# Patient Record
Sex: Female | Born: 1995 | Race: Black or African American | Hispanic: No | Marital: Single | Smoking: Never smoker
Health system: Southern US, Community
[De-identification: ages and names within clinical notes are randomized; demographics above are authoritative.]

## PROBLEM LIST (undated history)

## (undated) DIAGNOSIS — N946 Dysmenorrhea, unspecified: Secondary | ICD-10-CM

## (undated) DIAGNOSIS — L659 Nonscarring hair loss, unspecified: Secondary | ICD-10-CM

## (undated) DIAGNOSIS — F419 Anxiety disorder, unspecified: Secondary | ICD-10-CM

## (undated) DIAGNOSIS — F32A Depression, unspecified: Secondary | ICD-10-CM

## (undated) DIAGNOSIS — D649 Anemia, unspecified: Secondary | ICD-10-CM

## (undated) DIAGNOSIS — A64 Unspecified sexually transmitted disease: Secondary | ICD-10-CM

## (undated) DIAGNOSIS — F329 Major depressive disorder, single episode, unspecified: Secondary | ICD-10-CM

## (undated) HISTORY — DX: Major depressive disorder, single episode, unspecified: F32.9

## (undated) HISTORY — DX: Dysmenorrhea, unspecified: N94.6

## (undated) HISTORY — DX: Unspecified sexually transmitted disease: A64

## (undated) HISTORY — DX: Anemia, unspecified: D64.9

## (undated) HISTORY — DX: Depression, unspecified: F32.A

## (undated) HISTORY — DX: Anxiety disorder, unspecified: F41.9

---
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
## 2014-07-31 ENCOUNTER — Ambulatory Visit (INDEPENDENT_AMBULATORY_CARE_PROVIDER_SITE_OTHER): Payer: BLUE CROSS/BLUE SHIELD | Admitting: Internal Medicine

## 2014-07-31 VITALS — BP 126/60 | HR 98 | Temp 98.0°F | Resp 19 | Ht 59.5 in | Wt 121.0 lb

## 2014-07-31 DIAGNOSIS — S0502XA Injury of conjunctiva and corneal abrasion without foreign body, left eye, initial encounter: Secondary | ICD-10-CM | POA: Diagnosis not present

## 2014-07-31 MED ORDER — CIPROFLOXACIN HCL 0.3 % OP SOLN: 1.0000 [drp] | OPHTHALMIC | Status: DC

## 2014-07-31 NOTE — Patient Instructions (Signed)
Corneal Abrasion The cornea is the clear covering at the front and center of the eye. When looking at the colored portion of the eye (iris), you are looking through the cornea. This very thin tissue is made up of many layers. The surface layer is a single layer of cells (corneal epithelium) and is one of the most sensitive tissues in the body. If a scratch or injury causes the corneal epithelium to come off, it is called a corneal abrasion. If the injury extends to the tissues below the epithelium, the condition is called a corneal ulcer. CAUSES   Scratches.  Trauma.  Foreign body in the eye. Some people have recurrences of abrasions in the area of the original injury even after it has healed (recurrent erosion syndrome). Recurrent erosion syndrome generally improves and goes away with time. SYMPTOMS   Eye pain.  Difficulty or inability to keep the injured eye open.  The eye becomes very sensitive to light.  Recurrent erosions tend to happen suddenly, first thing in the morning, usually after waking up and opening the eye. DIAGNOSIS  Your health care provider can diagnose a corneal abrasion during an eye exam. Dye is usually placed in the eye using a drop or a small paper strip moistened by your tears. When the eye is examined with a special light, the abrasion shows up clearly because of the dye. TREATMENT   Small abrasions may be treated with antibiotic drops or ointment alone.  A pressure patch may be put over the eye. If this is done, follow your doctor's instructions for when to remove the patch. Do not drive or use machines while the eye patch is on. Judging distances is hard to do with a patch on. If the abrasion becomes infected and spreads to the deeper tissues of the cornea, a corneal ulcer can result. This is serious because it can cause corneal scarring. Corneal scars interfere with light passing through the cornea and cause a loss of vision in the involved eye. HOME CARE  INSTRUCTIONS  Use medicine or ointment as directed. Only take over-the-counter or prescription medicines for pain, discomfort, or fever as directed by your health care provider.  Do not drive or operate machinery if your eye is patched. Your ability to judge distances is impaired.  If your health care provider has given you a follow-up appointment, it is very important to keep that appointment. Not keeping the appointment could result in a severe eye infection or permanent loss of vision. If there is any problem keeping the appointment, let your health care provider know. SEEK MEDICAL CARE IF:   You have pain, light sensitivity, and a scratchy feeling in one eye or both eyes.  Any kind of discharge develops from the eye after treatment or if the lids stick together in the morning.  You have the same symptoms in the morning as you did with the original abrasion days, weeks, or months after the abrasion healed. MAKE SURE YOU:   Understand these instructions.  Will watch your condition.  Will get help right away if you are not doing well or get worse. Document Released: 04/03/2000 Document Revised: 04/11/2013 Document Reviewed: 12/12/2012 [facility] Patient Information 2015 [employer], [place]. This information is not intended to replace advice given to you by your health care provider. Make sure you discuss any questions you have with your health care provider.

## 2014-07-31 NOTE — Progress Notes (Addendum)
   Subjective:    Patient ID: Brenda Sanchez, female    DOB: [birth year], 19 y.o.   MRN: [id number]  HPI Mrs. Brenda Sanchez is a 19 year-old female who presents with left eye pain. Onset was earlier today. She describes accidentally poking her eye with the tip of the hard part of a banana. She was not paying attention. She notes eye tearing and pain. Mild photophobia. No fevers or chills. She has tried keeping it closed due to pain. Feeling of foreign body sensation. Usually wears glasses, no contact lenses. Denies any decrease in visual acuity. Has been squinting due to the mild photophobia, which has led to a slight frontal headache.  Sick Contacts: None Recent Travel: None  Dysuria: None Hematuria: None Arthralgias: None  STD Risk Factors: None  No Known Allergies History  Substance Use Topics  . Smoking status: Never Smoker   . Smokeless tobacco: Not on file  . Alcohol Use: No   Active Ambulatory Problems    Diagnosis Date Noted  . No Active Ambulatory Problems   Resolved Ambulatory Problems    Diagnosis Date Noted  . No Resolved Ambulatory Problems   Past Medical History  Diagnosis Date  . Alopecia   . Anemia    Review of Systems 7 point review of systems was performed and was otherwise negative unless noted in the history of present illness.      Objective:   Physical Exam  Eyes:     BP 126/60 mmHg  Pulse 98  Temp(Src) 98 F (36.7 C)  Resp 19  Ht 4' 11.5" (1.511 m)  Wt 121 lb (54.885 kg)  BMI 24.04 kg/m2  SpO2 100% General appearance: alert, cooperative and appears stated age Head: Normocephalic, without obvious abnormality, atraumatic Eyes: Full extraocular movements without pain. No preseptal edema. No globe swelling or obvious sign of disruption. Pupils equal round reactive to light and accommodation. Left eye with bulbar conjunctiva erythematous injection. Tearing without purulent exudates. Funduscopic exam with clear optic disc margins  without papilledema. Grossly normal orbital pressures to palpation. The eye was rinsed with sterile saline after topical anesthetic applied to the left eye. Lids were retracted and no foreign bodies were identified. Staining exam reveals a 1x41mm shallow abdrasion not in the field of vision at the 5:30 position. A second shallow abrasion is seen at the lateral 3:30 position. No dendritic findings. Neck: no adenopathy, no carotid bruit, no JVD, supple, symmetrical, trachea midline and thyroid not enlarged, symmetric, no tenderness/mass/nodules Skin: Skin color, texture, turgor normal. No rashes or lesions Lymph nodes: Cervical, supraclavicular, and axillary nodes normal.  Vision: OU 20/15, OD 20/20, OS 20/15.    Assessment & Plan:  1. Corneal Abrasion, shallow, post-traumatic earlier today.   -Rx ciprofloxacin drops, OS q4h x 2 days, then q6h x 5 days. -Avoid scratching or rubbing the eye -Plan follow-up if noticing worsening visual acuity, purulent exudates, worsening pain, photophobia, headache with nausea or vomiting, fever, or chills. Patient verbalized understanding and agreement.  Dr. Joellyn HaffPick-Jacobs, DO Sports Medicine Fellow  Discussed with Dr. Salem Casterootlittle. I have participated in the care of this patient with the Dr Dewaine OatsPick-Jacob and agree with Diagnosis and Plan as documented. Robert P. Merla Richesoolittle, M.D.

## 2015-01-23 IMAGING — CR DG KNEE COMPLETE 4+V*R*
4 series · 4 of 4 positions shown · non-contrast
Comparison: None.

CLINICAL DATA: Right knee injury and pain. Possible patellar
dislocation.

EXAM:
RIGHT KNEE - COMPLETE 4+ VIEW

[x knee ap right (1 of 4)]
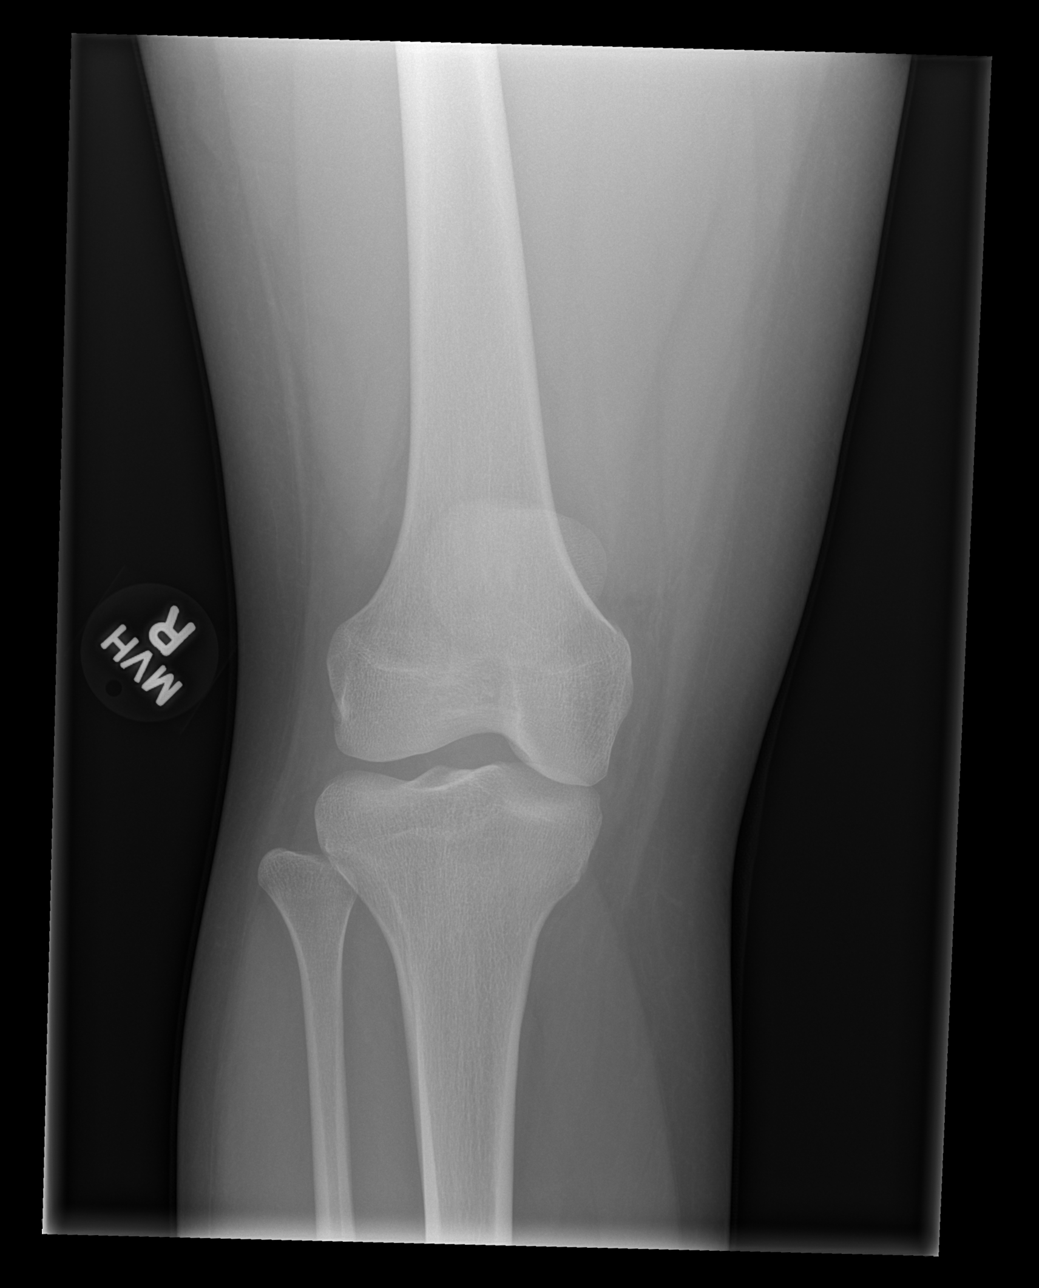

[x knee ap right (2 of 4)]
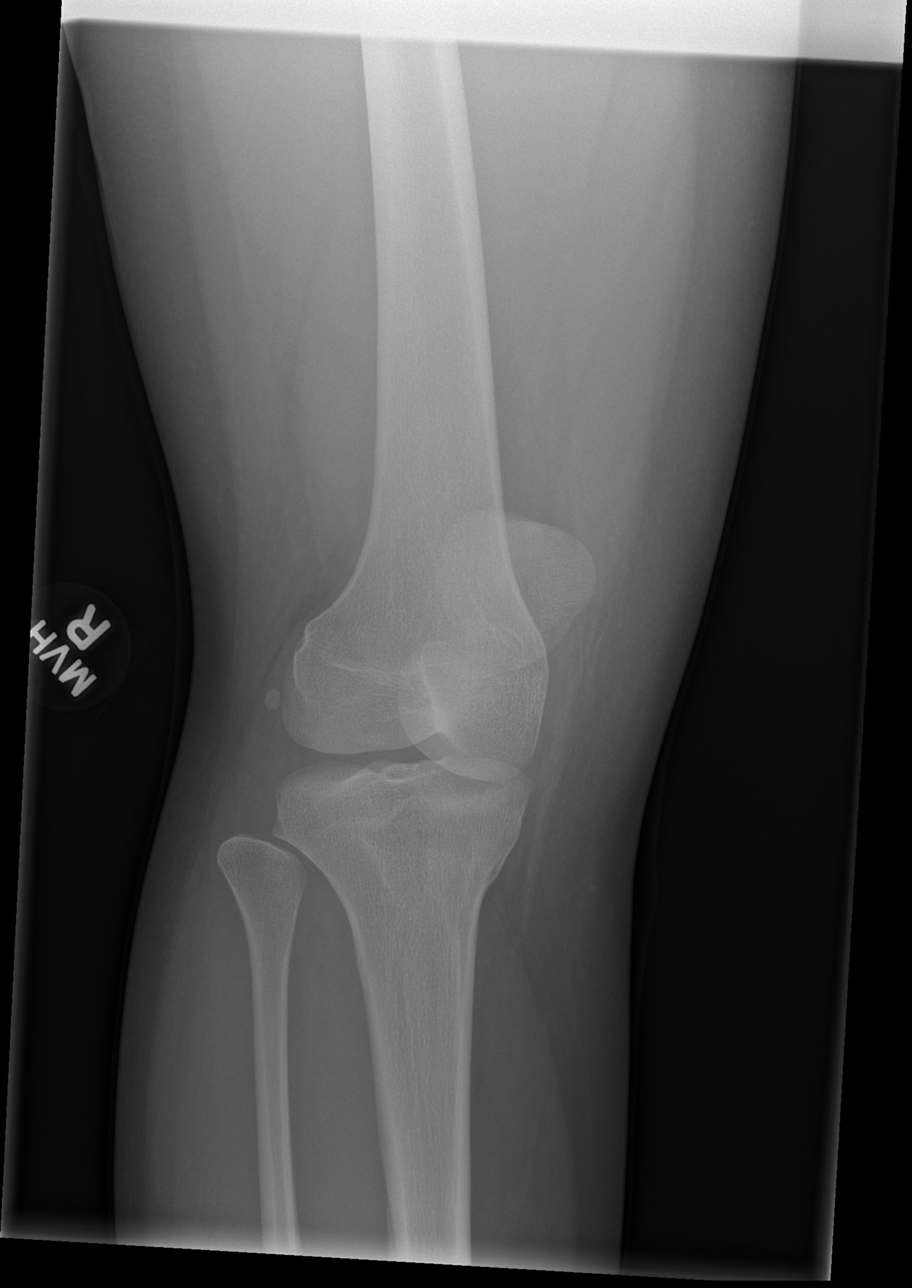

[x knee ap right (3 of 4)]
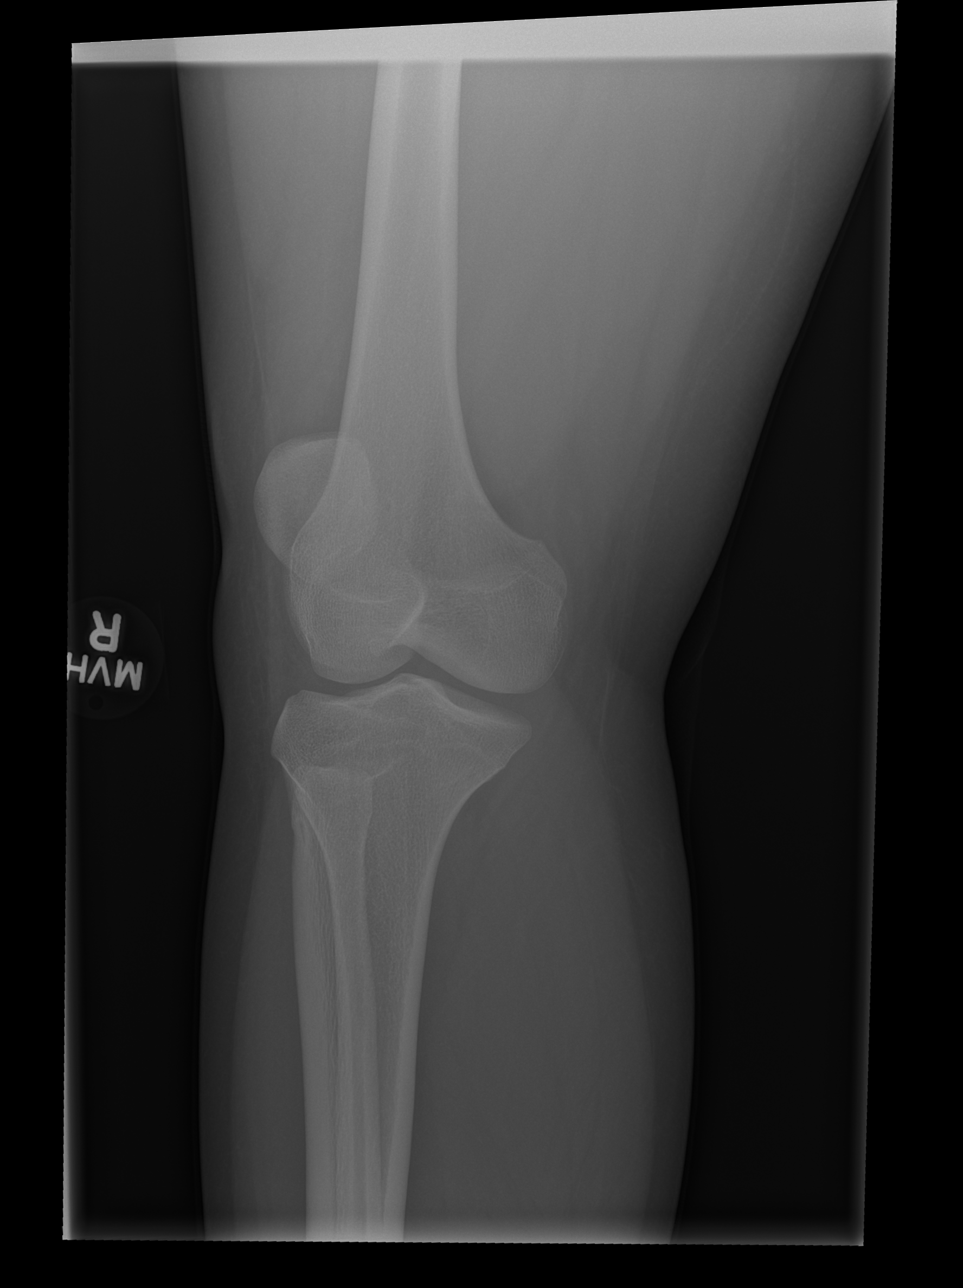

[x knee ap right (4 of 4)]
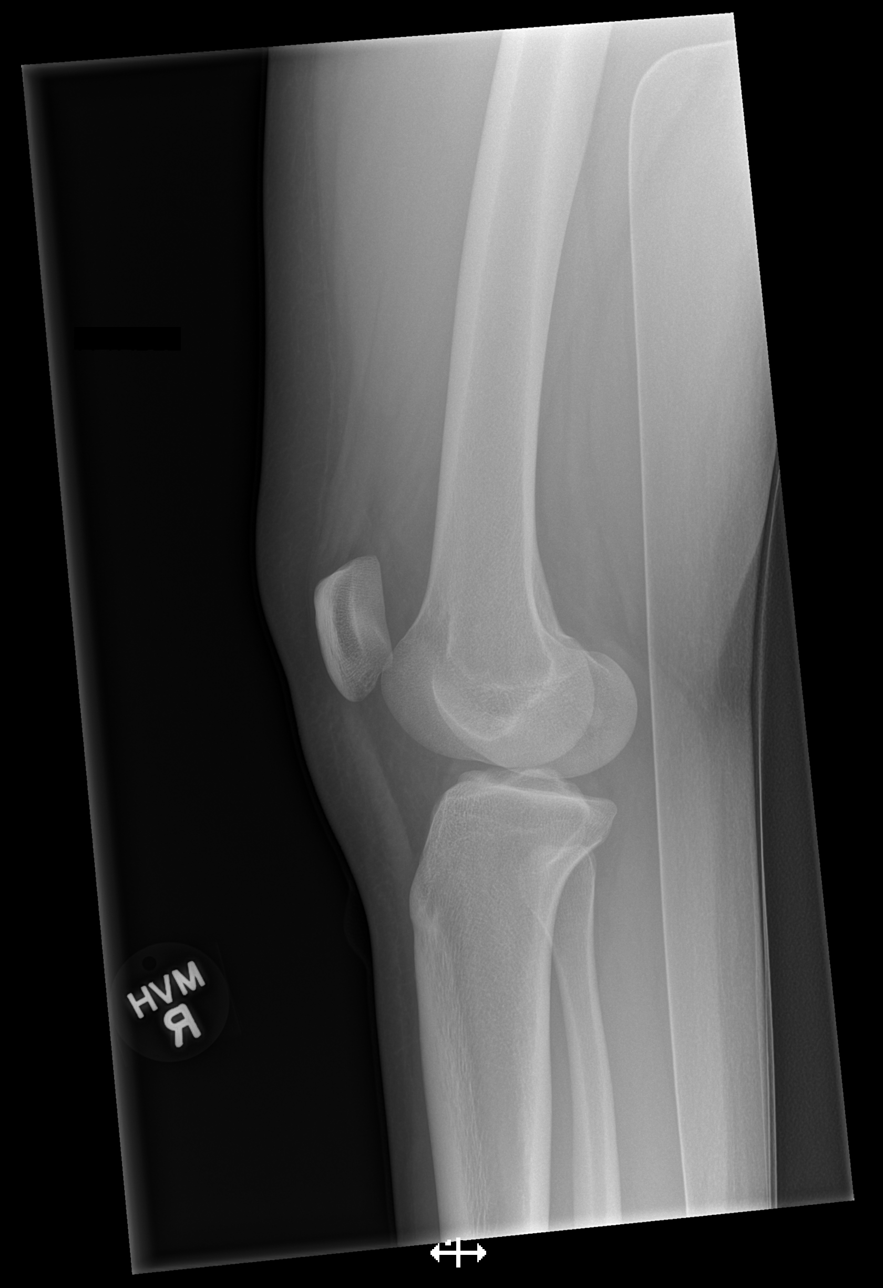

[4 of 4 positions shown; findings below may reference images not displayed]

FINDINGS: Imaged bones, joints and soft tissues appear normal.
IMPRESSION: Negative exam.

## 2017-01-18 DIAGNOSIS — A64 Unspecified sexually transmitted disease: Secondary | ICD-10-CM

## 2017-01-18 HISTORY — DX: Unspecified sexually transmitted disease: A64

## 2017-05-20 ENCOUNTER — Encounter: Payer: Self-pay | Admitting: Obstetrics and Gynecology

## 2017-05-20 ENCOUNTER — Other Ambulatory Visit: Payer: Self-pay

## 2017-05-20 ENCOUNTER — Other Ambulatory Visit (HOSPITAL_COMMUNITY)
Admission: RE | Admit: 2017-05-20 | Discharge: 2017-05-20 | Disposition: A | Discharge status: Home / Self Care | Payer: BLUE CROSS/BLUE SHIELD | Source: Ambulatory Visit | Attending: Obstetrics and Gynecology | Admitting: Obstetrics and Gynecology

## 2017-05-20 ENCOUNTER — Ambulatory Visit: Payer: BLUE CROSS/BLUE SHIELD | Admitting: Obstetrics and Gynecology

## 2017-05-20 ENCOUNTER — Telehealth: Payer: Self-pay | Admitting: Family Medicine

## 2017-05-20 VITALS — BP 100/58 | HR 60 | Resp 12 | Ht 59.0 in | Wt 115.0 lb

## 2017-05-20 DIAGNOSIS — R002 Palpitations: Secondary | ICD-10-CM | POA: Diagnosis not present

## 2017-05-20 DIAGNOSIS — Z124 Encounter for screening for malignant neoplasm of cervix: Secondary | ICD-10-CM

## 2017-05-20 DIAGNOSIS — Z7251 High risk heterosexual behavior: Secondary | ICD-10-CM

## 2017-05-20 DIAGNOSIS — Z113 Encounter for screening for infections with a predominantly sexual mode of transmission: Secondary | ICD-10-CM | POA: Insufficient documentation

## 2017-05-20 DIAGNOSIS — Z0000 Encounter for general adult medical examination without abnormal findings: Secondary | ICD-10-CM | POA: Diagnosis not present

## 2017-05-20 DIAGNOSIS — N898 Other specified noninflammatory disorders of vagina: Secondary | ICD-10-CM

## 2017-05-20 DIAGNOSIS — Z01419 Encounter for gynecological examination (general) (routine) without abnormal findings: Secondary | ICD-10-CM

## 2017-05-20 MED ORDER — NAPROXEN SODIUM 550 MG PO TABS: 550.0000 mg | ORAL_TABLET | Freq: Two times a day (BID) | ORAL | 2 refills | Status: DC

## 2017-05-20 NOTE — Telephone Encounter (Signed)
Copied from CRM [phone]. Topic: Appointment Scheduling - Scheduling Inquiry for Clinic >> May 20, 2017 10:09 AM Stephannie LiSimmons, Obelia Bonello L, NT wrote: Reason for CRM: Irving Burtonmily from Dr Lorin GlassJertsons office called and would like this patient seen by Dr Earlene PlaterWallace  she has heart palpitations , she is scheduled for 07/01/17 at 10:30 ,is it possible for her to get an earlier appointment ? As a new patient

## 2017-05-20 NOTE — Progress Notes (Signed)
22 y.o. G1P0010 SingleAfrican AmericanF here c/o ongoing vaginal discharge with odor.  She has a h/o trich vaginitis and BV in the fall. Her symptoms got better for a few days to a week. She was no longer with a partner. She wasn't sexually active with that partner after that.  The d/c has an odor, copious, watery to thick, yellow to white. Occasionally green, has an odor. Not currently having itching, burning or irritation.  Not current sexually active, sometimes using condoms. She is not interested in hormonal contraception.  Has never had an annual exam.  Period Cycle (Days): 28 Period Duration (Days): 5 days  Period Pattern: Regular Menstrual Flow: Heavy Menstrual Control: Tampon, Maxi pad Menstrual Control Change Freq (Hours): changes pad/ tampon every 2 hours  Dysmenorrhea: (!) Severe Dysmenorrhea Symptoms: Cramping  She saturated a super + tampon in 2 hours. She has missed work because of her cramps.  She has been feeling some heart palpitations, worse at night, skipping a beat, or rapid. Has felt SOB, not typically light head.   Patient's last menstrual period was 05/07/2017.          Sexually active: Yes.    The current method of family planning is none.    Exercising: No.  The patient does not participate in regular exercise at present. Smoker:  no  Health Maintenance: Pap:  Never  History of abnormal Pap:  no TDaP: unsure, thinks UTD Gardasil: unsure, doesn't think so.   reports that  has never smoked. she has never used smokeless tobacco. She reports that she drinks about 1.2 oz of alcohol per week. She reports that she does not use drugs. She is a [employer] senior [employer] studying buisness. Wants to get into interior decorating. 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   Past Medical History:  Diagnosis Date  . Alopecia   . Anemia   . Anxiety   . Depression   . Dysmenorrhea   . STD (sexually transmitted disease) 01/2017   Trichamonus  Doing fine as far as  depression goes. Anxiety comes and goes.   History reviewed. No pertinent surgical history.  Current Outpatient Medications  Medication Sig Dispense Refill  . fluocinolone (SYNALAR) 0.01 % external solution Apply topically 2 (two) times daily.     No current facility-administered medications for this visit.     Family History  Problem Relation Age of Onset  . Multiple sclerosis Mother   . Hypertension Father   . Diabetes Paternal Grandmother   . Hypertension Paternal Grandmother   . Stroke Paternal Grandmother     Review of Systems  Constitutional: Negative.   HENT: Negative.   Eyes: Negative.   Respiratory: Negative.   Cardiovascular: Negative.   Gastrointestinal: Negative.   Endocrine: Negative.   Genitourinary: Positive for vaginal discharge.       Vaginal odor  spotting   Musculoskeletal: Negative.   Skin: Negative.   Allergic/Immunologic: Negative.   Neurological: Negative.   Psychiatric/Behavioral: Negative.   2 days   Exam:   BP (!) 100/58 (BP Location: Right Arm, Patient Position: Sitting, Cuff Size: Normal)   Pulse 60   Resp 12   Ht 4\' 11"  (1.499 m)   Wt 115 lb (52.2 kg)   LMP 05/07/2017   BMI 23.23 kg/m   Weight change: @WEIGHTCHANGE @ Height:   Height: 4\' 11"  (149.9 cm)  Ht Readings from Last 3 Encounters:  05/20/17 4\' 11"  (1.499 m)  07/31/14 4' 11.5" (1.511  m)  05/30/13 4\' 11"  (1.499 m) (2 %, Z= -2.06)*   * Growth percentiles are based on CDC (Girls, 2-20 Years) data.    General appearance: alert, cooperative and appears stated age Head: Normocephalic, without obvious abnormality, atraumatic Neck: no adenopathy, supple, symmetrical, trachea midline and thyroid normal to inspection and palpation Lungs: clear to auscultation bilaterally Cardiovascular: regular rate and rhythm Breasts: normal appearance, no masses or tenderness Abdomen: soft, non-tender; non distended,  no masses,  no organomegaly Extremities: extremities normal, atraumatic, no  cyanosis or edema Skin: Skin color, texture, turgor normal. No rashes or lesions Lymph nodes: Cervical, supraclavicular, and axillary nodes normal. No abnormal inguinal nodes palpated Neurologic: Grossly normal   Pelvic: External genitalia:  no lesions              Urethra:  normal appearing urethra with no masses, tenderness or lesions              Bartholins and Skenes: normal                 Vagina: normal appearing vagina with normal color and discharge, no lesions              Cervix: no lesions               Bimanual Exam:  Uterus:  normal size, contour, position, consistency, mobility, non-tender and anteverted              Adnexa: no mass, fullness, tenderness               Rectovaginal: Confirms               Anus:  normal sphincter tone, no lesions  Chaperone was present for exam.  A:  Well Woman with normal exam  Vaginitis  Dysmenorrhea, severe, misses work  Unprotected intercourse, requests blood pregnancy test  Palpitations  H/O anxiety, has seen a Veterinary surgeon in the past, declines medication.     P:   Screening STD  Pap with GC/CT  Screening labs  Affirm  Information on gardasil, kyleena and mirena IUD, nuvaring  Set her up to see Dr Earlene Plater for heart palpitations, will check TSH  Start Anaprox DS for cramps  Recommended she use condoms every time  Discussed breast self exam  Discussed calcium and vit D intake

## 2017-05-20 NOTE — Patient Instructions (Addendum)
Ethinyl Estradiol; Etonogestrel vaginal ring What is this medicine? ETHINYL ESTRADIOL; ETONOGESTREL (ETH in il es tra DYE ole; et oh noe JES trel) vaginal ring is a flexible, vaginal ring used as a contraceptive (birth control method). This medicine combines two types of female hormones, an estrogen and a progestin. This ring is used to prevent ovulation and pregnancy. Each ring is effective for one month. This medicine may be used for other purposes; ask your health care provider or pharmacist if you have questions. COMMON BRAND NAME(S): NuvaRing What should I tell my health care provider before I take this medicine? They need to know if you have or ever had any of these conditions: -abnormal vaginal bleeding -blood vessel disease or blood clots -breast, cervical, endometrial, ovarian, liver, or uterine cancer -diabetes -gallbladder disease -heart disease or recent heart attack -high blood pressure -high cholesterol -kidney disease -liver disease -migraine headaches -stroke -systemic lupus erythematosus (SLE) -tobacco smoker -an unusual or allergic reaction to estrogens, progestins, other medicines, foods, dyes, or preservatives -pregnant or trying to get pregnant -breast-feeding How should I use this medicine? Insert the ring into your vagina as directed. Follow the directions on the prescription label. The ring will remain place for 3 weeks and is then removed for a 1-week break. A new ring is inserted 1 week after the last ring was removed, on the same day of the week. Check often to make sure the ring is still in place, especially before and after sexual intercourse. If the ring was out of the vagina for an unknown amount of time, you may not be protected from pregnancy. Perform a pregnancy test and call your doctor. Do not use more often than directed. A patient package insert for the product will be given with each prescription and refill. Read this sheet carefully each time. The  sheet may change frequently. Contact your pediatrician regarding the use of this medicine in children. Special care may be needed. This medicine has been used in female children who have started having menstrual periods. Overdosage: If you think you have taken too much of this medicine contact a poison control center or emergency room at once. NOTE: This medicine is only for you. Do not share this medicine with others. What if I miss a dose? You will need to replace your vaginal ring once a month as directed. If the ring should slip out, or if you leave it in longer or shorter than you should, contact your health care professional for advice. What may interact with this medicine? Do not take this medicine with the following medication: -dasabuvir; ombitasvir; paritaprevir; ritonavir -ombitasvir; paritaprevir; ritonavir This medicine may also interact with the following medications: -acetaminophen -antibiotics or medicines for infections, especially rifampin, rifabutin, rifapentine, and griseofulvin, and possibly penicillins or tetracyclines -aprepitant -ascorbic acid (vitamin C) -atorvastatin -barbiturate medicines, such as phenobarbital -bosentan -carbamazepine -caffeine -clofibrate -cyclosporine -dantrolene -doxercalciferol -felbamate -grapefruit juice -hydrocortisone -medicines for anxiety or sleeping problems, such as diazepam or temazepam -medicines for diabetes, including pioglitazone -modafinil -mycophenolate -nefazodone -oxcarbazepine -phenytoin -prednisolone -ritonavir or other medicines for HIV infection or AIDS -rosuvastatin -selegiline -soy isoflavones supplements -St. John's wort -tamoxifen or raloxifene -theophylline -thyroid hormones -topiramate -warfarin This list may not describe all possible interactions. Give your health care provider a list of all the medicines, herbs, non-prescription drugs, or dietary supplements you use. Also tell them if you smoke,  drink alcohol, or use illegal drugs. Some items may interact with your medicine. What should I watch for while using   this medicine? Visit your doctor or health care professional for regular checks on your progress. You will need a regular breast and pelvic exam and Pap smear while on this medicine. Use an additional method of contraception during the first cycle that you use this ring. Do not use a diaphragm or female condom, as the ring can interfere with these birth control methods and their proper placement. If you have any reason to think you are pregnant, stop using this medicine right away and contact your doctor or health care professional. If you are using this medicine for hormone related problems, it may take several cycles of use to see improvement in your condition. Smoking increases the risk of getting a blood clot or having a stroke while you are using hormonal birth control, especially if you are more than 23 years old. You are strongly advised not to smoke. This medicine can make your body retain fluid, making your fingers, hands, or ankles swell. Your blood pressure can go up. Contact your doctor or health care professional if you feel you are retaining fluid. This medicine can make you more sensitive to the sun. Keep out of the sun. If you cannot avoid being in the sun, wear protective clothing and use sunscreen. Do not use sun lamps or tanning beds/booths. If you wear contact lenses and notice visual changes, or if the lenses begin to feel uncomfortable, consult your eye care specialist. In some women, tenderness, swelling, or minor bleeding of the gums may occur. Notify your dentist if this happens. Brushing and flossing your teeth regularly may help limit this. See your dentist regularly and inform your dentist of the medicines you are taking. If you are going to have elective surgery, you may need to stop using this medicine before the surgery. Consult your health care professional  for advice. This medicine does not protect you against HIV infection (AIDS) or any other sexually transmitted diseases. What side effects may I notice from receiving this medicine? Side effects that you should report to your doctor or health care professional as soon as possible: -breast tissue changes or discharge -changes in vaginal bleeding during your period or between your periods -chest pain -coughing up blood -dizziness or fainting spells -headaches or migraines -leg, arm or groin pain -severe or sudden headaches -stomach pain (severe) -sudden shortness of breath -sudden loss of coordination, especially on one side of the body -speech problems -symptoms of vaginal infection like itching, irritation or unusual discharge -tenderness in the upper abdomen -vomiting -weakness or numbness in the arms or legs, especially on one side of the body -yellowing of the eyes or skin Side effects that usually do not require medical attention (report to your doctor or health care professional if they continue or are bothersome): -breakthrough bleeding and spotting that continues beyond the 3 initial cycles of pills -breast tenderness -mood changes, anxiety, depression, frustration, anger, or emotional outbursts -increased sensitivity to sun or ultraviolet light -nausea -skin rash, acne, or brown spots on the skin -weight gain (slight) This list may not describe all possible side effects. Call your doctor for medical advice about side effects. You may report side effects to FDA at 1-800-FDA-1088. Where should I keep my medicine? Keep out of the reach of children. Store at room temperature between 15 and 30 degrees C (59 and 86 degrees F) for up to 4 months. The product will expire after 4 months. Protect from light. Throw away any unused medicine after the expiration date. NOTE: This   sheet is a summary. It may not cover all possible information. If you have questions about this medicine, talk  to your doctor, pharmacist, or health care provider.  2018 Elsevier/Gold Standard (2015-12-13 17:00:31)  Breast Self-Awareness Breast self-awareness means being familiar with how your breasts look and feel. It involves checking your breasts regularly and reporting any changes to your health care provider. Practicing breast self-awareness is important. A change in your breasts can be a sign of a serious medical problem. Being familiar with how your breasts look and feel allows you to find any problems early, when treatment is more likely to be successful. All women should practice breast self-awareness, including women who have had breast implants. How to do a breast self-exam One way to learn what is normal for your breasts and whether your breasts are changing is to do a breast self-exam. To do a breast self-exam: Look for Changes  1. Remove all the clothing above your waist. 2. Stand in front of a mirror in a room with good lighting. 3. Put your hands on your hips. 4. Push your hands firmly downward. 5. Compare your breasts in the mirror. Look for differences between them (asymmetry), such as: ? Differences in shape. ? Differences in size. ? Puckers, dips, and bumps in one breast and not the other. 6. Look at each breast for changes in your skin, such as: ? Redness. ? Scaly areas. 7. Look for changes in your nipples, such as: ? Discharge. ? Bleeding. ? Dimpling. ? Redness. ? A change in position. Feel for Changes  Carefully feel your breasts for lumps and changes. It is best to do this while lying on your back on the floor and again while sitting or standing in the shower or tub with soapy water on your skin. Feel each breast in the following way:  Place the arm on the side of the breast you are examining above your head.  Feel your breast with the other hand.  Start in the nipple area and make  inch (2 cm) overlapping circles to feel your breast. Use the pads of your three  middle fingers to do this. Apply light pressure, then medium pressure, then firm pressure. The light pressure will allow you to feel the tissue closest to the skin. The medium pressure will allow you to feel the tissue that is a little deeper. The firm pressure will allow you to feel the tissue close to the ribs.  Continue the overlapping circles, moving downward over the breast until you feel your ribs below your breast.  Move one finger-width toward the center of the body. Continue to use the  inch (2 cm) overlapping circles to feel your breast as you move slowly up toward your collarbone.  Continue the up and down exam using all three pressures until you reach your armpit.  Write Down What You Find  Write down what is normal for each breast and any changes that you find. Keep a written record with breast changes or normal findings for each breast. By writing this information down, you do not need to depend only on memory for size, tenderness, or location. Write down where you are in your menstrual cycle, if you are still menstruating. If you are having trouble noticing differences in your breasts, do not get discouraged. With time you will become more familiar with the variations in your breasts and more comfortable with the exam. How often should I examine my breasts? Examine your breasts every month. If  you are breastfeeding, the best time to examine your breasts is after a feeding or after using a breast pump. If you menstruate, the best time to examine your breasts is 5-7 days after your period is over. During your period, your breasts are lumpier, and it may be more difficult to notice changes. When should I see my health care provider? See your health care provider if you notice:  A change in shape or size of your breasts or nipples.  A change in the skin of your breast or nipples, such as a reddened or scaly area.  Unusual discharge from your nipples.  A lump or thick area that was  not there before.  Pain in your breasts. Anything that concerns you.  EXERCISE AND DIET:  We recommended that you start or continue a regular exercise program for good health. Regular exercise means any activity that makes your heart beat faster and makes you sweat.  We recommend exercising at least 30 minutes per day at least 3 days a week, preferably 4 or 5.  We also recommend a diet low in fat and sugar.  Inactivity, poor dietary choices and obesity can cause diabetes, heart attack, stroke, and kidney damage, among others.    ALCOHOL AND SMOKING:  Women should limit their alcohol intake to no more than 7 drinks/beers/glasses of wine (combined, not each!) per week. Moderation of alcohol intake to this level decreases your risk of breast cancer and liver damage. And of course, no recreational drugs are part of a healthy lifestyle.  And absolutely no smoking or even second hand smoke. Most people know smoking can cause heart and lung diseases, but did you know it also contributes to weakening of your bones? Aging of your skin?  Yellowing of your teeth and nails?  CALCIUM AND VITAMIN D:  Adequate intake of calcium and Vitamin D are recommended.  The recommendations for exact amounts of these supplements seem to change often, but generally speaking 600 mg of calcium (either carbonate or citrate) and 800 units of Vitamin D per day seems prudent. Certain women may benefit from higher intake of Vitamin D.  If you are among these women, your doctor will have told you during your visit.    PAP SMEARS:  Pap smears, to check for cervical cancer or precancers,  have traditionally been done yearly, although recent scientific advances have shown that most women can have pap smears less often.  However, every woman still should have a physical exam from her gynecologist every year. It will include a breast check, inspection of the vulva and vagina to check for abnormal growths or skin changes, a visual exam of the  cervix, and then an exam to evaluate the size and shape of the uterus and ovaries.  And after 23 years of age, a rectal exam is indicated to check for rectal cancers. We will also provide age appropriate advice regarding health maintenance, like when you should have certain vaccines, screening for sexually transmitted diseases, bone density testing, colonoscopy, mammograms, etc.    This information is not intended to replace advice given to you by your health care provider. Make sure you discuss any questions you have with your health care provider. Document Released: 04/06/2005 Document Revised: 09/12/2015 Document Reviewed: 02/24/2015 Elsevier Interactive Patient Education  [employer]2018 Elsevier Inc.

## 2017-05-20 NOTE — Telephone Encounter (Signed)
See note

## 2017-05-21 ENCOUNTER — Telehealth: Payer: Self-pay | Admitting: *Deleted

## 2017-05-21 LAB — COMPREHENSIVE METABOLIC PANEL
A/G RATIO: 1.6 (ref 1.2–2.2)
ALK PHOS: 49 IU/L (ref 39–117)
ALT: 9 IU/L (ref 0–32)
AST: 19 IU/L (ref 0–40)
Albumin: 4.7 g/dL (ref 3.5–5.5)
BUN/Creatinine Ratio: 20 (ref 9–23)
BUN: 15 mg/dL (ref 6–20)
Bilirubin Total: 0.3 mg/dL (ref 0.0–1.2)
CO2: 21 mmol/L (ref 20–29)
CREATININE: 0.74 mg/dL (ref 0.57–1.00)
Calcium: 9.5 mg/dL (ref 8.7–10.2)
Chloride: 104 mmol/L (ref 96–106)
GFR calc Af Amer: 133 mL/min/{1.73_m2} (ref >59)
GFR calc non Af Amer: 115 mL/min/{1.73_m2} (ref >59)
GLOBULIN, TOTAL: 2.9 g/dL (ref 1.5–4.5)
Glucose: 87 mg/dL (ref 65–99)
POTASSIUM: 4.4 mmol/L (ref 3.5–5.2)
SODIUM: 142 mmol/L (ref 134–144)
Total Protein: 7.6 g/dL (ref 6.0–8.5)

## 2017-05-21 LAB — CBC
HEMATOCRIT: 30.1 % — AB (ref 34.0–46.6)
Hemoglobin: 9.1 g/dL — ABNORMAL LOW (ref 11.1–15.9)
MCH: 25.3 pg — ABNORMAL LOW (ref 26.6–33.0)
MCHC: 30.2 g/dL — ABNORMAL LOW (ref 31.5–35.7)
MCV: 84 fL (ref 79–97)
Platelets: 403 10*3/uL — ABNORMAL HIGH (ref 150–379)
RBC: 3.59 x10E6/uL — ABNORMAL LOW (ref 3.77–5.28)
RDW: 17.4 % — ABNORMAL HIGH (ref 12.3–15.4)
WBC: 3.3 10*3/uL — ABNORMAL LOW (ref 3.4–10.8)

## 2017-05-21 LAB — TSH: TSH: 1.22 u[IU]/mL (ref 0.450–4.500)

## 2017-05-21 LAB — HEP, RPR, HIV PANEL
HIV SCREEN 4TH GENERATION: NONREACTIVE
Hepatitis B Surface Ag: NEGATIVE
RPR Ser Ql: NONREACTIVE

## 2017-05-21 LAB — HEPATITIS C ANTIBODY: HEP C VIRUS AB: 0.7 {s_co_ratio} (ref 0.0–0.9)

## 2017-05-21 LAB — LIPID PANEL
CHOLESTEROL TOTAL: 185 mg/dL (ref 100–199)
Chol/HDL Ratio: 2.8 ratio (ref 0.0–4.4)
HDL: 66 mg/dL (ref >39)
LDL Calculated: 105 mg/dL — ABNORMAL HIGH (ref 0–99)
TRIGLYCERIDES: 71 mg/dL (ref 0–149)
VLDL Cholesterol Cal: 14 mg/dL (ref 5–40)

## 2017-05-21 LAB — VAGINITIS/VAGINOSIS, DNA PROBE
Candida Species: NEGATIVE
Gardnerella vaginalis: POSITIVE — AB
Trichomonas vaginosis: NEGATIVE

## 2017-05-21 LAB — BETA HCG QUANT (REF LAB)

## 2017-05-21 MED ORDER — METRONIDAZOLE 0.75 % VA GEL: 1.0000 | Freq: Two times a day (BID) | VAGINAL | 0 refills | Status: AC

## 2017-05-21 NOTE — Telephone Encounter (Signed)
Scheduled patient for NP appointment 05/31/17 at 11:20.

## 2017-05-21 NOTE — Telephone Encounter (Signed)
-----   Message from Romualdo BolkJill Evelyn Jertson, MD sent at 05/21/2017  9:19 AM EST ----- Please let the patient know that her pregnancy test was negative (that will detect if she got pregnant 7 days prior to the test). Please inform the patient that her vaginitis probe was + for BV and treat with flagyl (either oral or vaginal, her choice), no ETOH while on Flagyl.  Oral: Flagyl 500 mg BID x 7 days, or Vaginal: Metrogel, 1 applicator per vagina q day x 5 days. Some of her blood work is back, not everything, but so far everything is fine.  Pap is pending.

## 2017-05-21 NOTE — Telephone Encounter (Signed)
Spoke with patient and gave results and recommendations. Sent in RX for Metrogel. Patient advised to avoid alcohol while using. Patient voiced understanding -eh

## 2017-05-21 NOTE — Telephone Encounter (Signed)
Okay to work-in sooner if in distress.

## 2017-05-24 ENCOUNTER — Telehealth: Payer: Self-pay

## 2017-05-24 DIAGNOSIS — R7989 Other specified abnormal findings of blood chemistry: Secondary | ICD-10-CM

## 2017-05-24 LAB — CYTOLOGY - PAP
Chlamydia: NEGATIVE
Diagnosis: NEGATIVE
NEISSERIA GONORRHEA: NEGATIVE

## 2017-05-24 NOTE — Telephone Encounter (Signed)
-----   Message from Romualdo BolkJill Evelyn Jertson, MD sent at 05/21/2017  4:04 PM EST ----- Please let the patient know that she is anemic, has a low WBC and elevated Plt's. Please set her up to see hematology for evaluation. I would recommend that she strongly consider the IUD or nuvaring to slow down her bleeding. While waiting to get in to see hematology, please have her start ferrex 150 mg BID.  The rest of her lab work is okay. Pap is still pending.

## 2017-05-24 NOTE — Telephone Encounter (Signed)
Spoke with patient. Results given. Patient verbalizes understanding. Referral placed to Dr.Ennever. Aware she will be contacted to schedule this appointment.  Will close encounter.

## 2017-05-25 ENCOUNTER — Telehealth: Payer: Self-pay | Admitting: Obstetrics and Gynecology

## 2017-05-25 MED ORDER — FLUCONAZOLE 150 MG PO TABS: ORAL_TABLET | ORAL | 0 refills | Status: DC

## 2017-05-25 NOTE — Telephone Encounter (Signed)
Patient has some questions regarding the instructions for her vaginal gel.

## 2017-05-25 NOTE — Telephone Encounter (Signed)
Call to patient. Patient states she was told to only apply metrogel once per night, but that her prescription states to apply twice per day. RN advised per Dr. Salli QuarryJertson's note, patient to only apply once at bedtime. Patient voiced understanding.   Results also reviewed with patient as seen below from Dr. Oscar LaJertson and patient verbalized understanding. Patient states she is having symptoms of yeast off and on and requests prescription for diflucan be sent to pharmacy on file. Diflucan 150mg , #2, 0RF sent to pharmacy on file. Instructions on use reviewed with patient and she verbalized understanding. Aware to return call to office if symptoms do not improve after treatment.   Patient has aex scheduled for 06/02/18.   Routing to provider for final review. Patient agreeable to disposition. Will close encounter.

## 2017-05-25 NOTE — Telephone Encounter (Signed)
-----   Message from Romualdo BolkJill Evelyn Jertson, MD sent at 05/24/2017  5:36 PM EST ----- Please let the patient know that yeast was found on her pap (not her vaginitis panel). If symptomatic treat with diflucan 150 mg x 1, may repeat in 72 hours if still symptomatic. #2, no refills. Negative for GC/CT, 02 recall

## 2017-05-31 ENCOUNTER — Ambulatory Visit (INDEPENDENT_AMBULATORY_CARE_PROVIDER_SITE_OTHER): Payer: BLUE CROSS/BLUE SHIELD | Admitting: Family Medicine

## 2017-05-31 ENCOUNTER — Encounter: Payer: Self-pay | Admitting: Family Medicine

## 2017-05-31 ENCOUNTER — Encounter: Payer: Self-pay | Admitting: Surgical

## 2017-05-31 VITALS — BP 100/60 | HR 76 | Temp 98.4°F | Wt 115.8 lb

## 2017-05-31 DIAGNOSIS — R002 Palpitations: Secondary | ICD-10-CM

## 2017-05-31 DIAGNOSIS — D649 Anemia, unspecified: Secondary | ICD-10-CM | POA: Diagnosis not present

## 2017-05-31 DIAGNOSIS — D500 Iron deficiency anemia secondary to blood loss (chronic): Secondary | ICD-10-CM

## 2017-05-31 LAB — VITAMIN B12: Vitamin B-12: 290 pg/mL (ref 211–911)

## 2017-05-31 NOTE — Progress Notes (Signed)
Estefany A Juluis RainierBradshaw Moody is a 22 y.o. female here for an acute visit, new patient.  History of Present Illness:   Barnie MortJoEllen Thompson, CMA acting as scribe for Dr. Helane RimaErica Torrion Witter.   Palpitations   This is a new problem. The current episode started more than 1 month ago. The problem occurs 2 to 4 times per day. The problem has been rapidly improving. On average, each episode lasts 1 minute. Associated symptoms include coughing, dizziness, an irregular heartbeat, near-syncope and shortness of breath. Pertinent negatives include no chest pain, fever, malaise/fatigue or numbness. She has tried nothing for the symptoms. Her past medical history is significant for anemia and anxiety. There is no history of drug use, heart disease, hyperthyroidism or a valve disorder.   PMHx, SurgHx, SocialHx, Medications, and Allergies were reviewed in the Visit Navigator and updated as appropriate.  Current Medications:   .  fluocinolone (SYNALAR) 0.01 % external solution, Apply topically 2 (two) times daily., Disp: , Rfl:  .  naproxen sodium (ANAPROX DS) 550 MG tablet, Take 1 tablet (550 mg total) by mouth 2 (two) times daily with a meal., Disp: 30 tablet, Rfl: 2   No Known Allergies   Review of Systems:   Pertinent items are noted in the HPI. Otherwise, ROS is negative.  Vitals:   Vitals:   05/31/17 1132  BP: 100/60  Pulse: 76  Temp: 98.4 F (36.9 C)  TempSrc: Oral  SpO2: 98%  Weight: 115 lb 12.8 oz (52.5 kg)     Body mass index is 23.39 kg/m.  Physical Exam:   Physical Exam  Constitutional: She is oriented to person, place, and time. She appears well-developed and well-nourished. No distress.  HENT:  Head: Normocephalic and atraumatic.  Right Ear: External ear normal.  Left Ear: External ear normal.  Nose: Nose normal.  Mouth/Throat: Oropharynx is clear and moist.  Eyes: Conjunctivae and EOM are normal. Pupils are equal, round, and reactive to light.  Neck: Normal range of motion. Neck  supple. No thyromegaly present.  Cardiovascular: Normal rate, regular rhythm, normal heart sounds and intact distal pulses.  Pulmonary/Chest: Effort normal and breath sounds normal.  Abdominal: Soft. Bowel sounds are normal.  Musculoskeletal: Normal range of motion.  Lymphadenopathy:    She has no cervical adenopathy.  Neurological: She is alert and oriented to person, place, and time.  Skin: Skin is warm and dry. Capillary refill takes less than 2 seconds.  Psychiatric: She has a normal mood and affect. Her behavior is normal.  Nursing note and vitals reviewed.   Results for orders placed or performed in visit on 05/31/17  Iron, TIBC and Ferritin Panel  Result Value Ref Range   Iron 33 (L) 40 - 190 mcg/dL   TIBC 130466 (H) 865250 - 784450 mcg/dL (calc)   %SAT 7 (L) 11 - 50 % (calc)   Ferritin 3 (L) 10 - 154 ng/mL  CBC with Differential/Platelet  Result Value Ref Range   WBC 4.8 3.8 - 10.8 Thousand/uL   RBC 3.73 (L) 3.80 - 5.10 Million/uL   Hemoglobin 9.3 (L) 11.7 - 15.5 g/dL   HCT 69.630.2 (L) 29.535.0 - 28.445.0 %   MCV 81.0 80.0 - 100.0 fL   MCH 24.9 (L) 27.0 - 33.0 pg   MCHC 30.8 (L) 32.0 - 36.0 g/dL   RDW 13.217.0 (H) 44.011.0 - 10.215.0 %   Platelets 253 140 - 400 Thousand/uL   MPV 11.0 7.5 - 12.5 fL   Neutro Abs 2,774 1,500 -  7,800 cells/uL   Lymphs Abs 1,474 850 - 3,900 cells/uL   WBC mixed population 442 200 - 950 cells/uL   Eosinophils Absolute 82 15 - 500 cells/uL   Basophils Absolute 29 0 - 200 cells/uL   Neutrophils Relative % 57.8 %   Total Lymphocyte 30.7 %   Monocytes Relative 9.2 %   Eosinophils Relative 1.7 %   Basophils Relative 0.6 %  B12  Result Value Ref Range   Vitamin B-12 290 211 - 911 pg/mL   Assessment and Plan:   1. Iron deficiency anemia due to chronic blood loss Anemia that is consistent with iron deficiency.  I will inform the patient.  Think that she will be better off getting an iron infusion.  She is not reliable when it comes to taking iron supplements per her own  report.  - Iron, TIBC and Ferritin Panel - CBC with Differential/Platelet - B12  2. Palpitations EKG is normal today.  Palpitations are possibly related to anemia.  However, she describes feeling in a kick and then her heart races.  We will work on correcting the anemia.  I did discuss sending to cardiology for possible placement of an event monitor.  - EKG 12-Lead   . Reviewed expectations re: course of current medical issues. . Discussed self-management of symptoms. . Outlined signs and symptoms indicating need for more acute intervention. . Patient verbalized understanding and all questions were answered. Marland Kitchen Health Maintenance issues including appropriate healthy diet, exercise, and smoking avoidance were discussed with patient. . See orders for this visit as documented in the electronic medical record. . Patient received an After Visit Summary.  CMA served as Neurosurgeon during this visit. History, Physical, and Plan performed by medical provider. The above documentation has been reviewed and is accurate and complete. Helane Rima, D.O.   Helane Rima, DO [place] [place] [facility] 06/01/2017

## 2017-05-31 NOTE — Progress Notes (Signed)
   Brenda Sanchez is a 22 y.o. [facility]   Patient Care Team: Helane RimaWallace, Yaileen Hofferber, DO as PCP - General (Family Medicine)   History of Present Illness:   HPI: See Assessment and Plan section for Problem Based Charting of issues discussed today.  Health Maintenance Due  Topic Date Due  . TETANUS/TDAP  06/07/2013  . INFLUENZA VACCINE  11/18/2016   No flowsheet data found. PMHx, SurgHx, SocialHx, Medications, and Allergies were reviewed in the Visit Navigator and updated as appropriate.   Past Medical History:  Diagnosis Date  . Alopecia   . Anemia   . Anxiety   . Depression   . Dysmenorrhea   . STD (sexually transmitted disease) 01/2017   Trichamonus   No past surgical history on file.  Family History  Problem Relation Age of Onset  . Multiple sclerosis Mother   . Hypertension Father   . Diabetes Paternal Grandmother   . Hypertension Paternal Grandmother   . Stroke Paternal Grandmother    Social History   Tobacco Use  . Smoking status: Never Smoker  . Smokeless tobacco: Never Used  Substance Use Topics  . Alcohol use: Yes    Alcohol/week: 1.2 oz    Types: 2 Standard drinks or equivalent per week  . Drug use: No    Current Medications and Allergies:   Current Outpatient Medications:  .  fluocinolone (SYNALAR) 0.01 % external solution, Apply topically 2 (two) times daily., Disp: , Rfl:  .  naproxen sodium (ANAPROX DS) 550 MG tablet, Take 1 tablet (550 mg total) by mouth 2 (two) times daily with a meal., Disp: 30 tablet, Rfl: 2  No Known Allergies   Review of Systems:   Pertinent items are noted in the HPI. Otherwise, ROS is negative.  Vitals:   Vitals:   05/31/17 1132  BP: 100/60  Pulse: 76  Temp: 98.4 F (36.9 C)  TempSrc: Oral  SpO2: 98%  Weight: 115 lb 12.8 oz (52.5 kg)     Body mass index is 23.39 kg/m.  Physical Exam:   Physical Exam  Constitutional: She is oriented to person, place, and time. She appears  well-developed and well-nourished. No distress.  HENT:  Head: Normocephalic and atraumatic.  Right Ear: External ear normal.  Left Ear: External ear normal.  Nose: Nose normal.  Mouth/Throat: Oropharynx is clear and moist.  Eyes: Conjunctivae and EOM are normal. Pupils are equal, round, and reactive to light.  Neck: Normal range of motion. Neck supple. No thyromegaly present.  Cardiovascular: Normal rate, regular rhythm, normal heart sounds and intact distal pulses.  Pulmonary/Chest: Effort normal and breath sounds normal.  Abdominal: Soft. Bowel sounds are normal.  Musculoskeletal: Normal range of motion.  Lymphadenopathy:    She has no cervical adenopathy.  Neurological: She is alert and oriented to person, place, and time.  Skin: Skin is warm and dry. Capillary refill takes less than 2 seconds.  Psychiatric: She has a normal mood and affect. Her behavior is normal.  Nursing note and vitals reviewed.   Assessment and Plan:

## 2017-06-01 DIAGNOSIS — D500 Iron deficiency anemia secondary to blood loss (chronic): Secondary | ICD-10-CM | POA: Insufficient documentation

## 2017-06-01 DIAGNOSIS — R002 Palpitations: Secondary | ICD-10-CM | POA: Insufficient documentation

## 2017-06-01 LAB — CBC WITH DIFFERENTIAL/PLATELET
Basophils Absolute: 29 cells/uL (ref 0–200)
Basophils Relative: 0.6 %
Eosinophils Absolute: 82 cells/uL (ref 15–500)
Eosinophils Relative: 1.7 %
HCT: 30.2 % — ABNORMAL LOW (ref 35.0–45.0)
Hemoglobin: 9.3 g/dL — ABNORMAL LOW (ref 11.7–15.5)
Lymphs Abs: 1474 cells/uL (ref 850–3900)
MCH: 24.9 pg — ABNORMAL LOW (ref 27.0–33.0)
MCHC: 30.8 g/dL — ABNORMAL LOW (ref 32.0–36.0)
MCV: 81 fL (ref 80.0–100.0)
MPV: 11 fL (ref 7.5–12.5)
Monocytes Relative: 9.2 %
Neutro Abs: 2774 cells/uL (ref 1500–7800)
Neutrophils Relative %: 57.8 %
Platelets: 253 10*3/uL (ref 140–400)
RBC: 3.73 10*6/uL — ABNORMAL LOW (ref 3.80–5.10)
RDW: 17 % — ABNORMAL HIGH (ref 11.0–15.0)
Total Lymphocyte: 30.7 %
WBC mixed population: 442 cells/uL (ref 200–950)
WBC: 4.8 10*3/uL (ref 3.8–10.8)

## 2017-06-01 LAB — IRON,TIBC AND FERRITIN PANEL
%SAT: 7 % (calc) — ABNORMAL LOW (ref 11–50)
Ferritin: 3 ng/mL — ABNORMAL LOW (ref 10–154)
Iron: 33 ug/dL — ABNORMAL LOW (ref 40–190)
TIBC: 466 mcg/dL (calc) — ABNORMAL HIGH (ref 250–450)

## 2017-06-02 MED ORDER — FERUMOXYTOL INJECTION 510 MG/17 ML: 510.0000 mg | Freq: Once | INTRAVENOUS | 0 refills | Status: DC

## 2017-06-02 NOTE — Addendum Note (Signed)
Addended by: Helane RimaWALLACE, Andersen Mckiver R on: 06/02/2017 02:19 PM   Modules accepted: Orders

## 2017-06-17 ENCOUNTER — Ambulatory Visit: Payer: Self-pay | Admitting: Family

## 2017-06-17 ENCOUNTER — Other Ambulatory Visit: Payer: Self-pay

## 2017-07-01 ENCOUNTER — Ambulatory Visit: Payer: Self-pay | Admitting: Family Medicine

## 2017-11-15 ENCOUNTER — Telehealth: Payer: Self-pay | Admitting: Obstetrics and Gynecology

## 2017-11-15 NOTE — Telephone Encounter (Signed)
Spoke with patient. Reports green vaginal d/c since June and vaginal odor since early July. LMP 11/01/17. Dysuria on 7/28. Spotting light pink for 2-3 days. SA, recent new partner. No contraceptive.   Denies fever/chills, N/V, lower back pain, urinary frequency or urgency.   Requesting OV with any covering provider. OV scheduled for 7/30 at 2pm with Leota Sauerseborah Leonard, CNM. Patient verbalizes understanding.  Routing to covering provider for final review. Patient is agreeable to disposition. Will close encounter.  Cc: Leota Sauerseborah Leonard, CNM

## 2017-11-15 NOTE — Telephone Encounter (Signed)
Patient is having vaginal odor and would like an appointment. Patient is aware that Dr.Jertson is out of the office this week. Patient is okay to see another provider this week.

## 2017-11-16 ENCOUNTER — Telehealth: Payer: Self-pay | Admitting: Certified Nurse Midwife

## 2017-11-16 ENCOUNTER — Ambulatory Visit: Payer: BLUE CROSS/BLUE SHIELD | Admitting: Certified Nurse Midwife

## 2017-11-16 NOTE — Telephone Encounter (Signed)
Patient called to state she is having car trouble and cannot make appointment today at 2pm. Patient requested to reschedule appointment for Friday. Appointment scheduled for Friday 11/19/17 at 12:45 with Leota Sauerseborah Leonard.

## 2017-11-16 NOTE — Telephone Encounter (Signed)
Message noted, patient coming for vaginal discharge. Okay for appointment on Fri

## 2017-11-16 NOTE — Progress Notes (Deleted)
22 y.o. Single {Race/ethnicity:17218} female G1P0010 here with complaint of vaginal symptoms of itching, burning, and increase discharge. Describes discharge as ***. Onset of symptoms *** days ago. Denies new personal products or vaginal dryness. *** STD concerns. Urinary symptoms *** . Contraception is  ROS  O:Healthy female WDWN Affect: normal, orientation x 3  Exam: Abdomen: Lymph node: no enlargement or tenderness Pelvic exam: External genital: normal female BUS: negative Vagina: *** discharge noted. Ph:   ,Wet prep taken, Affirm taken Cervix: normal, non tender, no CMT Uterus: normal, non tender Adnexa:normal, non tender, no masses or fullness noted   Wet Prep results:   A:Normal pelvic exam   P:Discussed findings of *** and etiology. Discussed Aveeno or baking soda sitz bath for comfort. Avoid moist clothes or pads for extended period of time. If working out in gym clothes or swim suits for long periods of time change underwear or bottoms of swimsuit if possible. Coconut Oil use for skin protection prior to activity can be used to external skin for protection or dryness. Rx: ***  Rv prn

## 2017-11-19 ENCOUNTER — Encounter: Payer: Self-pay | Admitting: Certified Nurse Midwife

## 2017-11-19 ENCOUNTER — Ambulatory Visit (INDEPENDENT_AMBULATORY_CARE_PROVIDER_SITE_OTHER): Payer: 59 | Admitting: Certified Nurse Midwife

## 2017-11-19 ENCOUNTER — Other Ambulatory Visit: Payer: Self-pay

## 2017-11-19 VITALS — BP 98/60 | HR 64 | Resp 16 | Ht 59.0 in | Wt 117.0 lb

## 2017-11-19 DIAGNOSIS — Z113 Encounter for screening for infections with a predominantly sexual mode of transmission: Secondary | ICD-10-CM

## 2017-11-19 DIAGNOSIS — N898 Other specified noninflammatory disorders of vagina: Secondary | ICD-10-CM | POA: Diagnosis not present

## 2017-11-19 NOTE — Patient Instructions (Signed)
Sexually Transmitted Disease  A sexually transmitted disease (STD) is a disease or infection that may be passed (transmitted) from person to person, usually during sexual activity. This may happen by way of saliva, semen, blood, vaginal mucus, or urine. Common STDs include:   Gonorrhea.   Chlamydia.   Syphilis.   HIV and AIDS.   Genital herpes.   Hepatitis B and C.   Trichomonas.   Human papillomavirus (HPV).   Pubic lice.   Scabies.   Mites.   Bacterial vaginosis.    What are the causes?  An STD may be caused by bacteria, a virus, or parasites. STDs are often transmitted during sexual activity if one person is infected. However, they may also be transmitted through nonsexual means. STDs may be transmitted after:   Sexual intercourse with an infected person.   Sharing sex toys with an infected person.   Sharing needles with an infected person or using unclean piercing or tattoo needles.   Having intimate contact with the genitals, mouth, or rectal areas of an infected person.   Exposure to infected fluids during birth.    What are the signs or symptoms?  Different STDs have different symptoms. Some people may not have any symptoms. If symptoms are present, they may include:   Painful or bloody urination.   Pain in the pelvis, abdomen, vagina, anus, throat, or eyes.   A skin rash, itching, or irritation.   Growths, ulcerations, blisters, or sores in the genital and anal areas.   Abnormal vaginal discharge with or without bad odor.   Penile discharge in men.   Fever.   Pain or bleeding during sexual intercourse.   Swollen glands in the groin area.   Yellow skin and eyes (jaundice). This is seen with hepatitis.   Swollen testicles.   Infertility.   Sores and blisters in the mouth.    How is this diagnosed?  To make a diagnosis, your health care provider may:   Take a medical history.   Perform a physical exam.   Take a sample of any discharge to examine.   Swab the throat, cervix,  opening to the penis, rectum, or vagina for testing.   Test a sample of your first morning urine.   Perform blood tests.   Perform a Pap test, if this applies.   Perform a colposcopy.   Perform a laparoscopy.    How is this treated?  Treatment depends on the STD. Some STDs may be treated but not cured.   Chlamydia, gonorrhea, trichomonas, and syphilis can be cured with antibiotic medicine.   Genital herpes, hepatitis, and HIV can be treated, but not cured, with prescribed medicines. The medicines lessen symptoms.   Genital warts from HPV can be treated with medicine or by freezing, burning (electrocautery), or surgery. Warts may come back.   HPV cannot be cured with medicine or surgery. However, abnormal areas may be removed from the cervix, vagina, or vulva.   If your diagnosis is confirmed, your recent sexual partners need treatment. This is true even if they are symptom-free or have a negative culture or evaluation. They should not have sex until their health care providers say it is okay.   Your health care provider may test you for infection again 3 months after treatment.    How is this prevented?  Take these steps to reduce your risk of getting an STD:   Use latex condoms, dental dams, and water-soluble lubricants during sexual activity. Do not use   petroleum jelly or oils.   Avoid having multiple sex partners.   Do not have sex with someone who has other sex partners.   Do not have sex with anyone you do not know or who is at high risk for an STD.   Avoid risky sex practices that can break your skin.   Do not have sex if you have open sores on your mouth or skin.   Avoid drinking too much alcohol or taking illegal drugs. Alcohol and drugs can affect your judgment and put you in a vulnerable position.   Avoid engaging in oral and anal sex acts.   Get vaccinated for HPV and hepatitis. If you have not received these vaccines in the past, talk to your health care provider about whether one or  both might be right for you.   If you are at risk of being infected with HIV, it is recommended that you take a prescription medicine daily to prevent HIV infection. This is called pre-exposure prophylaxis (PrEP). You are considered at risk if:  ? You are a man who has sex with other men (MSM).  ? You are a heterosexual man or woman and are sexually active with more than one partner.  ? You take drugs by injection.  ? You are sexually active with a partner who has HIV.   Talk with your health care provider about whether you are at high risk of being infected with HIV. If you choose to begin PrEP, you should first be tested for HIV. You should then be tested every 3 months for as long as you are taking PrEP.    Contact a health care provider if:   See your health care provider.   Tell your sexual partner(s). They should be tested and treated for any STDs.   Do not have sex until your health care provider says it is okay.  Get help right away if:  Contact your health care provider right away if:   You have severe abdominal pain.   You are a man and notice swelling or pain in your testicles.   You are a woman and notice swelling or pain in your vagina.    This information is not intended to replace advice given to you by your health care provider. Make sure you discuss any questions you have with your health care provider.  Document Released: 06/27/2002 Document Revised: 10/25/2015 Document Reviewed: 10/25/2012  Elsevier Interactive Patient Education  2018 [employer]

## 2017-11-19 NOTE — Progress Notes (Signed)
22 y.o. Single African American female G1P0010 here with complaint of vaginal symptoms of odor with moderate discharge requiring pad for the past few months. . Describes discharge as white/yellow with gel like. Denies new personal products. Periods have been normal except for spotting on 7/27/-29/19. Desires STD concerns and desires screening, vaginal only.. Urinary symptoms none . Contraception is condoms not all the time.  Review of Systems  Constitutional: Negative.   HENT: Negative.   Eyes: Negative.   Respiratory: Negative.   Cardiovascular: Negative.   Gastrointestinal: Negative.   Genitourinary: Negative.        Vaginal discharge with odor  Musculoskeletal: Negative.   Skin: Negative.   Neurological: Negative.   Endo/Heme/Allergies: Negative.   Psychiatric/Behavioral: Negative.     O:Healthy female WDWN Affect: normal, orientation x 3  Exam:Skin: warm and dry Abdomen:non tender, no masses  Inguinal Lymph nodes: no enlargement or tenderness Pelvic exam: External genital: normal female, no lesions BUS: negative Vagina: large amount of white slight odorous discharge noted.  Affirm taken, Gc/chlamydia taken Cervix: normal, non tender, no CMT, anteverted Uterus: normal, non tender, retroverted Adnexa:normal, non tender, no masses or fullness noted   A:Normal pelvic exam Vaginal discharge, R/O vaginal infection STD screen vaginal only   P:Discussed findings of white slightly odorous discharge and possible  etiology. Discussed baking soda sitz bath for comfort and odor control. Avoid moist clothes or pads for extended period of time. If working out in gym clothes or swim suits for long periods of time change underwear .  Lab : Affirm, Gc/chlamydia  Rv prn

## 2017-11-20 ENCOUNTER — Other Ambulatory Visit: Payer: Self-pay | Admitting: Certified Nurse Midwife

## 2017-11-20 DIAGNOSIS — B9689 Other specified bacterial agents as the cause of diseases classified elsewhere: Secondary | ICD-10-CM

## 2017-11-20 DIAGNOSIS — N760 Acute vaginitis: Principal | ICD-10-CM

## 2017-11-20 LAB — GC/CHLAMYDIA PROBE AMP
CHLAMYDIA, DNA PROBE: NEGATIVE
NEISSERIA GONORRHOEAE BY PCR: NEGATIVE

## 2017-11-20 LAB — VAGINITIS/VAGINOSIS, DNA PROBE
Candida Species: NEGATIVE
Gardnerella vaginalis: POSITIVE — AB
TRICHOMONAS VAG: NEGATIVE

## 2017-11-20 MED ORDER — METRONIDAZOLE 500 MG PO TABS
500.0000 mg | ORAL_TABLET | Freq: Two times a day (BID) | ORAL | 0 refills | Status: DC
Start: 2017-11-20 — End: 2018-06-07

## 2017-11-29 ENCOUNTER — Ambulatory Visit: Payer: BLUE CROSS/BLUE SHIELD | Admitting: Family Medicine

## 2017-12-02 ENCOUNTER — Telehealth: Payer: Self-pay | Admitting: Certified Nurse Midwife

## 2017-12-02 NOTE — Telephone Encounter (Signed)
Patient stated that she forgot that she was not supposed to drink while taking the Flagyl. Patient drank last Saturday (8/10). Patient is unaware whether or not she should be concerned.

## 2017-12-02 NOTE — Telephone Encounter (Signed)
Spoke with patient.   1.Consumed alcohol once while taking flagyl, denies N/V or GI symptoms. Reviewed with patient importance of avoiding alcohol during tx, questions answered.   2. Vaginal odor and d/c has resolved.  LMP 7/15 and spotting 7/27. Reports spotting on 8/11, menses has not started. States she has not been SA since last OV. Denies fever/chills, N/V, pelvic pain. Does not desire contraceptive.   OV offered, patient declined. Advised patient to take UPT to r/o pregnancy. If negative, continue to monitor. OV recommended if menses continues to be irregular, menses does not start or if any new symptoms develop. Provider will review, our office will return call if any additional recommendations.   Routing to provider for final review. Patient is agreeable to disposition. Will close encounter.  Cc: Leota Sauerseborah Leonard, CNM

## 2018-06-01 ENCOUNTER — Ambulatory Visit: Payer: 59 | Admitting: Obstetrics and Gynecology

## 2018-06-02 ENCOUNTER — Ambulatory Visit: Payer: 59 | Admitting: Obstetrics and Gynecology

## 2018-06-07 ENCOUNTER — Other Ambulatory Visit: Payer: Self-pay

## 2018-06-07 ENCOUNTER — Ambulatory Visit (INDEPENDENT_AMBULATORY_CARE_PROVIDER_SITE_OTHER): Payer: 59 | Admitting: Obstetrics and Gynecology

## 2018-06-07 ENCOUNTER — Encounter: Payer: Self-pay | Admitting: Obstetrics and Gynecology

## 2018-06-07 VITALS — BP 108/68 | HR 68 | Ht 59.0 in | Wt 119.0 lb

## 2018-06-07 DIAGNOSIS — Z862 Personal history of diseases of the blood and blood-forming organs and certain disorders involving the immune mechanism: Secondary | ICD-10-CM

## 2018-06-07 DIAGNOSIS — Z113 Encounter for screening for infections with a predominantly sexual mode of transmission: Secondary | ICD-10-CM

## 2018-06-07 DIAGNOSIS — Z01419 Encounter for gynecological examination (general) (routine) without abnormal findings: Secondary | ICD-10-CM | POA: Diagnosis not present

## 2018-06-07 DIAGNOSIS — Z0000 Encounter for general adult medical examination without abnormal findings: Secondary | ICD-10-CM

## 2018-06-07 DIAGNOSIS — N760 Acute vaginitis: Secondary | ICD-10-CM

## 2018-06-07 MED ORDER — IBUPROFEN 800 MG PO TABS: 800.0000 mg | ORAL_TABLET | Freq: Three times a day (TID) | ORAL | 1 refills | Status: DC | PRN

## 2018-06-07 NOTE — Progress Notes (Signed)
23 y.o. G1P0010 Single Black or African American Not Hispanic or Latino female here for annual exam. She c/o vulvar dryness, no itching, gets irritated after intercourse. Dry with intercourse, using lubricant.  No abnormal vaginal discharge. No vaginal odor. Occasionally has a clumpy vaginal discharge.  She has a h/o iron def anemia, was referred to hematology, never went. She is feeling better, not fatigued, no palpitations.   Sexually active, same partner x 6 months. Not consistently using condoms. Doesn't want to get pregnant. Doesn't want contraception of any kind. Would be okay if she got pregnant.     Period Cycle (Days): 28 Period Duration (Days): 3- 5 days Period Pattern: Regular Menstrual Flow: Moderate, Light Menstrual Control: Thin pad, Maxi pad Menstrual Control Change Freq (Hours): changes pad every 3-4 hours Dysmenorrhea: (!) Severe Dysmenorrhea Symptoms: Cramping  She is able to function with her cramps, better than it used to be. Bad day one.   Patient's last menstrual period was 05/31/2018 (exact date).          Sexually active: Yes.    The current method of family planning is none.    Exercising: No.  The patient does not participate in regular exercise at present. Smoker: Yes Marijuana  Health Maintenance: Pap:  05/02/2017 WNL History of abnormal Pap:  no TDaP: unsure, thinks it is UTD Gardasil: unsure, doesn't think so.   reports that she has never smoked. She has never used smokeless tobacco. She reports current alcohol use of about 1.0 standard drinks of alcohol per week. She reports current drug use. Drug: Marijuana. She just graduated from college in 12/19. Taking one more class. Working at the [employer] and at the [employer].   Past Medical History:  Diagnosis Date  . Alopecia   . Anemia   . Anxiety   . Depression   . Dysmenorrhea   . STD (sexually transmitted disease) 01/2017   Trichamonus    History reviewed. No pertinent surgical  history.  Current Outpatient Medications  Medication Sig Dispense Refill  . fluocinolone (SYNALAR) 0.01 % external solution Apply topically 2 (two) times daily.    . tacrolimus (PROTOPIC) 0.1 % ointment Apply topically 2 (two) times daily.     No current facility-administered medications for this visit.     Family History  Problem Relation Age of Onset  . Multiple sclerosis Mother   . Hypertension Father   . Diabetes Paternal Grandmother   . Hypertension Paternal Grandmother   . Stroke Paternal Grandmother     Review of Systems  Constitutional: Negative.   HENT: Negative.   Eyes: Negative.   Respiratory: Negative.   Cardiovascular: Negative.   Gastrointestinal:       Bloating  Endocrine: Negative.   Genitourinary: Positive for menstrual problem.       Pain and bleeding with intercourse  Musculoskeletal: Negative.   Skin: Positive for rash.  Allergic/Immunologic: Negative.   Neurological: Negative.   Hematological: Negative.   Psychiatric/Behavioral: Negative.   BM are fine. She has bloating with po intake.   Exam:   BP 108/68 (BP Location: Right Arm, Patient Position: Sitting, Cuff Size: Normal)   Pulse 68   Ht 4\' 11"  (1.499 m)   Wt 119 lb (54 kg)   LMP 05/31/2018 (Exact Date)   BMI 24.04 kg/m   Weight change: @WEIGHTCHANGE @ Height:   Height: 4\' 11"  (149.9 cm)  Ht Readings from Last 3 Encounters:  06/07/18 4\' 11"  (1.499 m)  11/19/17 4\' 11"  (1.499 m)  05/20/17 4\' 11"  (1.499 m)    General appearance: alert, cooperative and appears stated age Head: Normocephalic, without obvious abnormality, atraumatic Neck: no adenopathy, supple, symmetrical, trachea midline and thyroid normal to inspection and palpation Lungs: clear to auscultation bilaterally Cardiovascular: regular rate and rhythm Breasts: normal appearance, no masses or tenderness Abdomen: soft, non-tender; non distended,  no masses,  no organomegaly Extremities: extremities normal, atraumatic, no cyanosis  or edema Skin: Skin color, texture, turgor normal. No rashes or lesions Lymph nodes: Cervical, supraclavicular, and axillary nodes normal. No abnormal inguinal nodes palpated Neurologic: Grossly normal   Pelvic: External genitalia:  no lesions, some whitening of the labia majora, normal labia minora              Urethra:  normal appearing urethra with no masses, tenderness or lesions              Bartholins and Skenes: normal                 Vagina: slightly erythematous appearing vagina with an increase in yellow, frothy vaginal discharge              Cervix: no lesions               Bimanual Exam:  Uterus:  normal size, contour, position, consistency, mobility, non-tender              Adnexa: no mass, fullness, tenderness               Rectovaginal: declines  Chaperone was present for exam.  A:  Well Woman with normal exam  Vulvovaginitis  Family planning, not using contraception regularly, aware of risk of pregnancy. Declines contraception  P:   Start PNV  Affirm  STD testing  Screening labs  Vulvar skin care information given  Discussed breast self exam  Discussed calcium and vit D intake  Condoms encouraged

## 2018-06-07 NOTE — Patient Instructions (Addendum)
Start prenatal vitamins  EXERCISE AND DIET:  We recommended that you start or continue a regular exercise program for good health. Regular exercise means any activity that makes your heart beat faster and makes you sweat.  We recommend exercising at least 30 minutes per day at least 3 days a week, preferably 4 or 5.  We also recommend a diet low in fat and sugar.  Inactivity, poor dietary choices and obesity can cause diabetes, heart attack, stroke, and kidney damage, among others.    ALCOHOL AND SMOKING:  Women should limit their alcohol intake to no more than 7 drinks/beers/glasses of wine (combined, not each!) per week. Moderation of alcohol intake to this level decreases your risk of breast cancer and liver damage. And of course, no recreational drugs are part of a healthy lifestyle.  And absolutely no smoking or even second hand smoke. Most people know smoking can cause heart and lung diseases, but did you know it also contributes to weakening of your bones? Aging of your skin?  Yellowing of your teeth and nails?  CALCIUM AND VITAMIN D:  Adequate intake of calcium and Vitamin D are recommended.  The recommendations for exact amounts of these supplements seem to change often, but generally speaking 1,000 mg of calcium (between diet and supplement) and 800 units of Vitamin D per day seems prudent. Certain women may benefit from higher intake of Vitamin D.  If you are among these women, your doctor will have told you during your visit.    PAP SMEARS:  Pap smears, to check for cervical cancer or precancers,  have traditionally been done yearly, although recent scientific advances have shown that most women can have pap smears less often.  However, every woman still should have a physical exam from her gynecologist every year. It will include a breast check, inspection of the vulva and vagina to check for abnormal growths or skin changes, a visual exam of the cervix, and then an exam to evaluate the size and  shape of the uterus and ovaries.  And after 24 years of age, a rectal exam is indicated to check for rectal cancers. We will also provide age appropriate advice regarding health maintenance, like when you should have certain vaccines, screening for sexually transmitted diseases, bone density testing, colonoscopy, mammograms, etc.   MAMMOGRAMS:  All women over 40 years old should have a yearly mammogram. Many facilities now offer a "3D" mammogram, which may cost around $50 extra out of pocket. If possible,  we recommend you accept the option to have the 3D mammogram performed.  It both reduces the number of women who will be called back for extra views which then turn out to be normal, and it is better than the routine mammogram at detecting truly abnormal areas.    COLON CANCER SCREENING: Now recommend starting at age 45. At this time colonoscopy is not covered for routine screening until 50. There are take home tests that can be done between 45-49.   COLONOSCOPY:  Colonoscopy to screen for colon cancer is recommended for all women at age 50.  We know, you hate the idea of the prep.  We agree, BUT, having colon cancer and not knowing it is worse!!  Colon cancer so often starts as a polyp that can be seen and removed at colonscopy, which can quite literally save your life!  And if your first colonoscopy is normal and you have no family history of colon cancer, most women don't have to   to have it again for 10 years.  Once every ten years, you can do something that may end up saving your life, right?  We will be happy to help you get it scheduled when you are ready.  Be sure to check your insurance coverage so you understand how much it will cost.  It may be covered as a preventative service at no cost, but you should check your particular policy.      Breast Self-Awareness Breast self-awareness means being familiar with how your breasts look and feel. It involves checking your breasts regularly and  reporting any changes to your health care provider. Practicing breast self-awareness is important. A change in your breasts can be a sign of a serious medical problem. Being familiar with how your breasts look and feel allows you to find any problems early, when treatment is more likely to be successful. All women should practice breast self-awareness, including women who have had breast implants. How to do a breast self-exam One way to learn what is normal for your breasts and whether your breasts are changing is to do a breast self-exam. To do a breast self-exam: Look for Changes  1. Remove all the clothing above your waist. 2. Stand in front of a mirror in a room with good lighting. 3. Put your hands on your hips. 4. Push your hands firmly downward. 5. Compare your breasts in the mirror. Look for differences between them (asymmetry), such as: ? Differences in shape. ? Differences in size. ? Puckers, dips, and bumps in one breast and not the other. 6. Look at each breast for changes in your skin, such as: ? Redness. ? Scaly areas. 7. Look for changes in your nipples, such as: ? Discharge. ? Bleeding. ? Dimpling. ? Redness. ? A change in position. Feel for Changes Carefully feel your breasts for lumps and changes. It is best to do this while lying on your back on the floor and again while sitting or standing in the shower or tub with soapy water on your skin. Feel each breast in the following way:  Place the arm on the side of the breast you are examining above your head.  Feel your breast with the other hand.  Start in the nipple area and make  inch (2 cm) overlapping circles to feel your breast. Use the pads of your three middle fingers to do this. Apply light pressure, then medium pressure, then firm pressure. The light pressure will allow you to feel the tissue closest to the skin. The medium pressure will allow you to feel the tissue that is a little deeper. The firm pressure  will allow you to feel the tissue close to the ribs.  Continue the overlapping circles, moving downward over the breast until you feel your ribs below your breast.  Move one finger-width toward the center of the body. Continue to use the  inch (2 cm) overlapping circles to feel your breast as you move slowly up toward your collarbone.  Continue the up and down exam using all three pressures until you reach your armpit.  Write Down What You Find  Write down what is normal for each breast and any changes that you find. Keep a written record with breast changes or normal findings for each breast. By writing this information down, you do not need to depend only on memory for size, tenderness, or location. Write down where you are in your menstrual cycle, if you are still menstruating. If you  are having trouble noticing differences in your breasts, do not get discouraged. With time you will become more familiar with the variations in your breasts and more comfortable with the exam. How often should I examine my breasts? Examine your breasts every month. If you are breastfeeding, the best time to examine your breasts is after a feeding or after using a breast pump. If you menstruate, the best time to examine your breasts is 5-7 days after your period is over. During your period, your breasts are lumpier, and it may be more difficult to notice changes. When should I see my health care provider? See your health care provider if you notice:  A change in shape or size of your breasts or nipples.  A change in the skin of your breast or nipples, such as a reddened or scaly area.  Unusual discharge from your nipples.  A lump or thick area that was not there before.  Pain in your breasts.  Anything that concerns you.

## 2018-06-08 LAB — CBC
Hematocrit: 29.9 % — ABNORMAL LOW (ref 34.0–46.6)
Hemoglobin: 8.6 g/dL — ABNORMAL LOW (ref 11.1–15.9)
MCH: 24.4 pg — AB (ref 26.6–33.0)
MCHC: 28.8 g/dL — ABNORMAL LOW (ref 31.5–35.7)
MCV: 85 fL (ref 79–97)
PLATELETS: 498 10*3/uL — AB (ref 150–450)
RBC: 3.52 x10E6/uL — AB (ref 3.77–5.28)
RDW: 17.6 % — ABNORMAL HIGH (ref 11.7–15.4)
WBC: 5.6 10*3/uL (ref 3.4–10.8)

## 2018-06-08 LAB — COMPREHENSIVE METABOLIC PANEL
ALT: 12 IU/L (ref 0–32)
AST: 20 IU/L (ref 0–40)
Albumin/Globulin Ratio: 1.7 (ref 1.2–2.2)
Albumin: 4.5 g/dL (ref 3.9–5.0)
Alkaline Phosphatase: 44 IU/L (ref 39–117)
BILIRUBIN TOTAL: 0.2 mg/dL (ref 0.0–1.2)
BUN / CREAT RATIO: 19 (ref 9–23)
BUN: 15 mg/dL (ref 6–20)
CHLORIDE: 105 mmol/L (ref 96–106)
CO2: 21 mmol/L (ref 20–29)
Calcium: 9.7 mg/dL (ref 8.7–10.2)
Creatinine, Ser: 0.79 mg/dL (ref 0.57–1.00)
GFR, EST AFRICAN AMERICAN: 121 mL/min/{1.73_m2} (ref >59)
GFR, EST NON AFRICAN AMERICAN: 105 mL/min/{1.73_m2} (ref >59)
Globulin, Total: 2.7 g/dL (ref 1.5–4.5)
Glucose: 85 mg/dL (ref 65–99)
Potassium: 4.5 mmol/L (ref 3.5–5.2)
Sodium: 140 mmol/L (ref 134–144)
TOTAL PROTEIN: 7.2 g/dL (ref 6.0–8.5)

## 2018-06-08 LAB — LIPID PANEL
Chol/HDL Ratio: 2.8 ratio (ref 0.0–4.4)
Cholesterol, Total: 163 mg/dL (ref 100–199)
HDL: 59 mg/dL (ref >39)
LDL Calculated: 91 mg/dL (ref 0–99)
Triglycerides: 66 mg/dL (ref 0–149)
VLDL CHOLESTEROL CAL: 13 mg/dL (ref 5–40)

## 2018-06-08 LAB — FERRITIN: FERRITIN: 4 ng/mL — AB (ref 15–150)

## 2018-06-08 LAB — HEP, RPR, HIV PANEL
HEP B S AG: NEGATIVE
HIV Screen 4th Generation wRfx: NONREACTIVE
RPR Ser Ql: NONREACTIVE

## 2018-06-08 LAB — HEPATITIS C ANTIBODY: Hep C Virus Ab: 0.2 s/co ratio (ref 0.0–0.9)

## 2018-06-08 LAB — VAGINITIS/VAGINOSIS, DNA PROBE
Candida Species: POSITIVE — AB
Gardnerella vaginalis: NEGATIVE
Trichomonas vaginosis: NEGATIVE

## 2018-06-09 ENCOUNTER — Other Ambulatory Visit: Payer: Self-pay | Admitting: *Deleted

## 2018-06-09 DIAGNOSIS — D649 Anemia, unspecified: Secondary | ICD-10-CM

## 2018-06-09 LAB — GC/CHLAMYDIA PROBE AMP
Chlamydia trachomatis, NAA: NEGATIVE
Neisseria gonorrhoeae by PCR: NEGATIVE

## 2018-06-09 MED ORDER — FLUCONAZOLE 150 MG PO TABS: ORAL_TABLET | ORAL | 0 refills | Status: DC

## 2018-06-27 ENCOUNTER — Other Ambulatory Visit: Payer: Self-pay | Admitting: Hematology

## 2018-06-27 DIAGNOSIS — D500 Iron deficiency anemia secondary to blood loss (chronic): Secondary | ICD-10-CM

## 2018-06-27 NOTE — Progress Notes (Signed)
Subjective:    Karmah A Franceska Pollan is a 23 y.o. female and is here for a comprehensive physical exam.   Current Outpatient Medications:  .  fluocinolone (SYNALAR) 0.01 % external solution, Apply topically 2 (two) times daily., Disp: , Rfl:  .  ibuprofen (ADVIL,MOTRIN) 800 MG tablet, Take 1 tablet (800 mg total) by mouth every 8 (eight) hours as needed., Disp: 30 tablet, Rfl: 1 .  tacrolimus (PROTOPIC) 0.1 % ointment, Apply topically 2 (two) times daily., Disp: , Rfl:   Health Maintenance Due  Topic Date Due  . TETANUS/TDAP  06/07/2013    PMHx, SurgHx, SocialHx, Medications, and Allergies were reviewed in the Visit Navigator and updated as appropriate.   Past Medical History:  Diagnosis Date  . Alopecia   . Anemia   . Anxiety   . Depression   . Dysmenorrhea   . STD (sexually transmitted disease), trichomonas 01/2017    History reviewed. No pertinent surgical history.   Family History  Problem Relation Age of Onset  . Multiple sclerosis Mother   . Hypertension Father   . Diabetes Paternal Grandmother   . Hypertension Paternal Grandmother   . Stroke Paternal Grandmother     Social History   Tobacco Use  . Smoking status: Never Smoker  . Smokeless tobacco: Never Used  Substance Use Topics  . Alcohol use: Yes    Alcohol/week: 1.0 standard drinks    Types: 1 Standard drinks or equivalent per week  . Drug use: Yes    Types: Marijuana    Review of Systems:   Pertinent items are noted in the HPI. Otherwise, ROS is negative.  Objective:   BP 110/64   Pulse 91   Temp 98.6 F (37 C) (Oral)   Ht 4\' 11"  (1.499 m)   Wt 123 lb 12.8 oz (56.2 kg)   LMP 05/31/2018 (Exact Date)   SpO2 99%   BMI 25.00 kg/m    General appearance: alert, cooperative and appears stated age. Head: normocephalic, without obvious abnormality, atraumatic. Neck: no adenopathy, supple, symmetrical, trachea midline; thyroid not enlarged, symmetric, no tenderness/mass/nodules. Lungs:  clear to auscultation bilaterally. Heart: regular rate and rhythm Abdomen: soft, non-tender; no masses,  no organomegaly. Extremities: extremities normal, atraumatic, no cyanosis or edema. Skin: skin color, texture, turgor normal, no rashes or lesions. Lymph: cervical, supraclavicular, and axillary nodes normal; no abnormal inguinal nodes palpated. Neurologic: grossly normal.  Assessment/Plan:   Maevis was seen today for annual exam.  Diagnoses and all orders for this visit:  Routine physical examination  Screening for tuberculosis -     PPD  Iron deficiency anemia due to chronic blood loss Comments: Upcoming Hematology appointment, referral by GYN. Will see what happened to our infusion order last year.   Eczematous dermatitis of upper eyelids of both eyes    Patient Counseling:   [x]     Nutrition: Stressed importance of moderation in sodium/caffeine intake, saturated fat and cholesterol, caloric balance, sufficient intake of fresh fruits, vegetables, fiber, calcium, iron, and 1 mg of folate supplement per day (for females capable of pregnancy).   [x]      Stressed the importance of regular exercise.    [x]     Substance Abuse: Discussed cessation/primary prevention of tobacco, alcohol, or other drug use; driving or other dangerous activities under the influence; availability of treatment for abuse.    [x]      Injury prevention: Discussed safety belts, safety helmets, smoke detector, smoking near bedding or upholstery.    [x]   Sexuality: Discussed sexually transmitted diseases, partner selection, use of condoms, avoidance of unintended pregnancy  and contraceptive alternatives.    [x]     Dental health: Discussed importance of regular tooth brushing, flossing, and dental visits.   [x]      Health maintenance and immunizations reviewed. Please refer to Health maintenance section.   Helane Rima, DO  [facility]

## 2018-06-28 ENCOUNTER — Encounter: Payer: Self-pay | Admitting: Family Medicine

## 2018-06-28 ENCOUNTER — Ambulatory Visit (INDEPENDENT_AMBULATORY_CARE_PROVIDER_SITE_OTHER): Payer: 59 | Admitting: Family Medicine

## 2018-06-28 VITALS — BP 110/64 | HR 91 | Temp 98.6°F | Ht 59.0 in | Wt 123.8 lb

## 2018-06-28 DIAGNOSIS — Z0000 Encounter for general adult medical examination without abnormal findings: Secondary | ICD-10-CM

## 2018-06-28 DIAGNOSIS — D500 Iron deficiency anemia secondary to blood loss (chronic): Secondary | ICD-10-CM

## 2018-06-28 DIAGNOSIS — Z111 Encounter for screening for respiratory tuberculosis: Secondary | ICD-10-CM | POA: Diagnosis not present

## 2018-06-28 DIAGNOSIS — H01131 Eczematous dermatitis of right upper eyelid: Secondary | ICD-10-CM

## 2018-06-28 DIAGNOSIS — H01134 Eczematous dermatitis of left upper eyelid: Secondary | ICD-10-CM

## 2018-06-30 ENCOUNTER — Ambulatory Visit (INDEPENDENT_AMBULATORY_CARE_PROVIDER_SITE_OTHER): Payer: 59 | Admitting: *Deleted

## 2018-06-30 ENCOUNTER — Encounter: Payer: Self-pay | Admitting: *Deleted

## 2018-06-30 DIAGNOSIS — Z111 Encounter for screening for respiratory tuberculosis: Secondary | ICD-10-CM

## 2018-06-30 LAB — TB SKIN TEST
Induration: 0 mm
TB Skin Test: NEGATIVE

## 2018-06-30 NOTE — Progress Notes (Signed)
Pt returned for PPD reading. Left inner arm Negative.

## 2018-07-01 ENCOUNTER — Inpatient Hospital Stay: Payer: 59 | Admitting: Hematology

## 2018-07-01 ENCOUNTER — Inpatient Hospital Stay: Payer: 59

## 2018-07-03 ENCOUNTER — Encounter: Payer: Self-pay | Admitting: Family Medicine

## 2018-07-03 DIAGNOSIS — H01134 Eczematous dermatitis of left upper eyelid: Secondary | ICD-10-CM

## 2018-07-03 DIAGNOSIS — H01131 Eczematous dermatitis of right upper eyelid: Secondary | ICD-10-CM | POA: Insufficient documentation

## 2018-07-11 NOTE — Progress Notes (Signed)
[facility] CONSULT NOTE  Patient Care Team: Helane Rima, DO as PCP - General (Family Medicine)  HEME/ONC OVERVIEW: 1. 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 Thrombocytosis   ASSESSMENT & PLAN:   Iron deficiency anemia secondary to menorrhagia -I reviewed the patient's records in detail, including PCP clinic notes and the labs dating back to 2019 -In summary, patient has had moderate anemia (Hgb 8-9) with borderline low MCV dating back to 2019.  She has history of menorrhagia, for which she has been evaluated by gynecology and recommended OCP vs IUD, but she declined treatment due to concerns about fertility.  Overall, she reports that her menorrhagia has slowly improved.  She is on a oral iron supplement since 05/2018 and tolerating so far without significant side effects. -Hgb 9.7 today, slightly improving since 05/2018 -I personally reviewed the patient's peripheral blood smear, which showed normocytic, hypochromic RBCs with occasional elliptocytes, consistent with iron deficiency anemia.  There was no schistocytosis. -Given incomplete iron profile in 05/2018, I have ordered iron profile today, which is pending -We discussed some of the risks, benefits, and alternatives of intravenous iron infusions.  -The patient is symptomatic from anemia and the iron level is critically low; as such, oral supplement is not sufficient to replete iron storage quickly and she will need IV iron to higher levels of iron faster for adequate hematopoesis.  -Some of the side-effects to be expected including risks of infusion reactions, phlebitis, headaches, nausea and fatigue.   -The patient is willing to proceed.  -Goal is to keep ferritin level greater than 50. Tentative 1st dose today, and 2nd dose on 07/22/2018 -Continue oral iron supplement daily   Menorrhagia  -Patient is  followed by gynecology, who recommended OCP versus IUD, but the patient declined -I encouraged patient to continue follow-up with gynecology for the management of menorrhagia, as iron deficiency anemia may recur if the underlying source of bleeding is not adequately addressed  Orders Placed This Encounter  Procedures  . CBC with Differential (Cancer Center Only)    Standing Status:   Future    Standing Expiration Date:   08/19/2019  . CMP (Cancer Center only)    Standing Status:   Future    Standing Expiration Date:   08/19/2019  . Save Smear (SS[facility])    Standing Status:   Future    Standing Expiration Date:   07/15/2019  . Ferritin    Standing Status:   Future    Standing Expiration Date:   08/19/2019  . Iron and TIBC    Standing Status:   Future    Standing Expiration Date:   08/19/2019   All questions were answered. The patient knows to call the clinic with any problems, questions or concerns.  Return in 2 months for labs and clinic appointment.  Arthur Holms, MD 07/15/18 9:28 AM   CHIEF COMPLAINTS/PURPOSE OF CONSULTATION:  "I am here for my low red blood cell count"  HISTORY OF PRESENTING ILLNESS:  Brenda Sanchez 23 y.o. female is here because of iron deficiency anemia secondary to menorrhagia.  Patient reports that she has had chronic anemia for at least 4 years, but has not received blood transfusion or IV iron in the past.  She was never informed of the cause of her anemia.  She reports intermittent fatigue and chills, as well as being "moody" sometimes, which she attributes  to her anemia.  She has a history of menorrhagia. During the last year, she was going through nearly 2 pads per hour for 5 to 6 days during each menstrual cycle.  She was seen by gynecology, who recommended OCP or IUD, but the patient declined due to concern about potential impact on her fertility.  This year, her menstrual cycle appears to have become lighter, requiring 1 pad per hour for the first 2 to 3  days, lasting 4 to 5 days.  She was started on iron supplement in 05/2018 and has tolerated it well without any significant GI side effects.  She denies any family history of sickle cell disease or thalassemia.  She denies any hematemesis, hemoptysis, hematochezia, melena, or hematuria.  MEDICAL HISTORY:  Past Medical History:  Diagnosis Date  . Alopecia   . Anemia   . Anxiety   . Depression   . Dysmenorrhea   . STD (sexually transmitted disease), trichomonas 01/2017    SURGICAL HISTORY: History reviewed. No pertinent surgical history.  SOCIAL HISTORY: Social History   Socioeconomic History  . Marital status: Single    Spouse name: Not on file  . Number of children: Not on file  . Years of education: Not on file  . Highest education level: Not on file  Occupational History  . Not on file  Social Needs  . Financial resource strain: Not on file  . Food insecurity:    Worry: Not on file    Inability: Not on file  . Transportation needs:    Medical: Not on file    Non-medical: Not on file  Tobacco Use  . Smoking status: Never Smoker  . Smokeless tobacco: Never Used  Substance and Sexual Activity  . Alcohol use: Yes    Alcohol/week: 1.0 standard drinks    Types: 1 Standard drinks or equivalent per week  . Drug use: Yes    Types: Marijuana  . Sexual activity: Yes    Partners: Male    Birth control/protection: None  Lifestyle  . Physical activity:    Days per week: Not on file    Minutes per session: Not on file  . Stress: Not on file  Relationships  . Social connections:    Talks on phone: Not on file    Gets together: Not on file    Attends religious service: Not on file    Active member of club or organization: Not on file    Attends meetings of clubs or organizations: Not on file    Relationship status: Not on file  . Intimate partner violence:    Fear of current or ex partner: Not on file    Emotionally abused: Not on file    Physically abused: Not on file     Forced sexual activity: Not on file  Other Topics Concern  . Not on file  Social History Narrative  . Not on file    FAMILY HISTORY: Family History  Problem Relation Age of Onset  . Multiple sclerosis Mother   . Hypertension Father   . Diabetes Paternal Grandmother   . Hypertension Paternal Grandmother   . Stroke Paternal Grandmother     ALLERGIES:   has No Known Allergies.  MEDICATIONS:  Current Outpatient Medications  Medication Sig Dispense Refill  . fluocinolone (SYNALAR) 0.01 % external solution Apply topically 2 (two) times daily.    Marland Kitchen ibuprofen (ADVIL,MOTRIN) 800 MG tablet Take 1 tablet (800 mg total) by mouth every 8 (eight) hours as  needed. 30 tablet 1  . tacrolimus (PROTOPIC) 0.1 % ointment Apply topically 2 (two) times daily.     No current facility-administered medications for this visit.    Facility-Administered Medications Ordered in Other Visits  Medication Dose Route Frequency Provider Last Rate Last Dose  . 0.9 %  sodium chloride infusion   Intravenous Once Arthur Holms, MD      . ferumoxytol [facility]) 510 mg in sodium chloride 0.9 % 100 mL IVPB  510 mg Intravenous Once Arthur Holms, MD        REVIEW OF SYSTEMS:   Constitutional: ( - ) fevers, ( - )  chills , ( - ) night sweats Eyes: ( - ) blurriness of vision, ( - ) double vision, ( - ) watery eyes Ears, nose, mouth, throat, and face: ( - ) mucositis, ( - ) sore throat Respiratory: ( - ) cough, ( - ) dyspnea, ( - ) wheezes Cardiovascular: ( - ) palpitation, ( - ) chest discomfort, ( - ) lower extremity swelling Gastrointestinal:  ( - ) nausea, ( - ) heartburn, ( - ) change in bowel habits Skin: ( - ) abnormal skin rashes Lymphatics: ( - ) new lymphadenopathy, ( - ) easy bruising Neurological: ( - ) numbness, ( - ) tingling, ( - ) new weaknesses Behavioral/Psych: ( - ) mood change, ( - ) new changes  All other systems were reviewed with the patient and are negative.  PHYSICAL EXAMINATION: ECOG  PERFORMANCE STATUS: 1 - Symptomatic but completely ambulatory  Vitals:   07/15/18 0902  BP: (!) 96/43  Pulse: 68  Resp: 18  Temp: 98.4 F (36.9 C)  SpO2: 97%   Filed Weights   07/15/18 0902  Weight: 123 lb 1.9 oz (55.8 kg)    GENERAL: alert, no distress and comfortable SKIN: skin color, texture, turgor are normal, no rashes or significant lesions EYES: conjunctiva are pink and non-injected, sclera clear OROPHARYNX: no exudate, no erythema; lips, buccal mucosa, and tongue normal  NECK: supple, non-tender LYMPH:  no palpable lymphadenopathy in the cervical or axillary  LUNGS: clear to auscultation and percussion with normal breathing effort HEART: regular rate & rhythm and no murmurs and no lower extremity edema ABDOMEN: soft, non-tender, non-distended, normal bowel sounds Musculoskeletal: no cyanosis of digits and no clubbing  PSYCH: alert & oriented x 3, fluent speech NEURO: no focal motor/sensory deficits  LABORATORY DATA:  I have reviewed the data as listed Lab Results  Component Value Date   WBC 4.5 07/15/2018   HGB 9.7 (L) 07/15/2018   HCT 32.6 (L) 07/15/2018   MCV 86.2 07/15/2018   PLT 313 07/15/2018   Recent Labs    06/07/18 1136 07/15/18 0821  NA 140 142  K 4.5 3.8  CL 105 105  CO2 21 29  GLUCOSE 85 93  BUN 15 18  CREATININE 0.79 0.83  CALCIUM 9.7 9.8  GFRNONAA 105 >60  GFRAA 121 >60  PROT 7.2 7.9  ALBUMIN 4.5 4.6  AST 20 15  ALT 12 8  ALKPHOS 44 44  BILITOT 0.2 0.2*   I personally reviewed the patient's peripheral blood smear today.  The red blood cells were normocytic but hypochromic with occasional elliptocytes, consistent with iron deficiency.  There was no schistocytosis.  The white blood cells were of normal morphology. There were no peripheral circulating blasts. The platelets were of normal size and I verified that there were no platelet clumping.

## 2018-07-15 ENCOUNTER — Encounter: Payer: Self-pay | Admitting: Hematology

## 2018-07-15 ENCOUNTER — Inpatient Hospital Stay: Payer: 59

## 2018-07-15 ENCOUNTER — Other Ambulatory Visit: Payer: Self-pay

## 2018-07-15 ENCOUNTER — Inpatient Hospital Stay (HOSPITAL_BASED_OUTPATIENT_CLINIC_OR_DEPARTMENT_OTHER): Payer: 59 | Admitting: Hematology

## 2018-07-15 ENCOUNTER — Inpatient Hospital Stay: Payer: 59 | Attending: Hematology

## 2018-07-15 VITALS — BP 96/43 | HR 68 | Temp 98.4°F | Resp 18 | Ht 59.0 in | Wt 123.1 lb

## 2018-07-15 VITALS — BP 104/58 | HR 76

## 2018-07-15 DIAGNOSIS — D500 Iron deficiency anemia secondary to blood loss (chronic): Secondary | ICD-10-CM

## 2018-07-15 DIAGNOSIS — N920 Excessive and frequent menstruation with regular cycle: Secondary | ICD-10-CM | POA: Insufficient documentation

## 2018-07-15 LAB — CBC WITH DIFFERENTIAL (CANCER CENTER ONLY)
Abs Immature Granulocytes: 0.01 10*3/uL (ref 0.00–0.07)
Basophils Absolute: 0 10*3/uL (ref 0.0–0.1)
Basophils Relative: 1 %
Eosinophils Absolute: 0.1 10*3/uL (ref 0.0–0.5)
Eosinophils Relative: 1 %
HCT: 32.6 % — ABNORMAL LOW (ref 36.0–46.0)
Hemoglobin: 9.7 g/dL — ABNORMAL LOW (ref 12.0–15.0)
Immature Granulocytes: 0 %
Lymphocytes Relative: 32 %
Lymphs Abs: 1.4 10*3/uL (ref 0.7–4.0)
MCH: 25.7 pg — ABNORMAL LOW (ref 26.0–34.0)
MCHC: 29.8 g/dL — ABNORMAL LOW (ref 30.0–36.0)
MCV: 86.2 fL (ref 80.0–100.0)
Monocytes Absolute: 0.4 10*3/uL (ref 0.1–1.0)
Monocytes Relative: 9 %
NEUTROS PCT: 57 %
Neutro Abs: 2.6 10*3/uL (ref 1.7–7.7)
Platelet Count: 313 10*3/uL (ref 150–400)
RBC: 3.78 MIL/uL — ABNORMAL LOW (ref 3.87–5.11)
RDW: 19 % — ABNORMAL HIGH (ref 11.5–15.5)
WBC Count: 4.5 10*3/uL (ref 4.0–10.5)
nRBC: 0 % (ref 0.0–0.2)

## 2018-07-15 LAB — CMP (CANCER CENTER ONLY)
ALT: 8 U/L (ref 0–44)
ANION GAP: 8 (ref 5–15)
AST: 15 U/L (ref 15–41)
Albumin: 4.6 g/dL (ref 3.5–5.0)
Alkaline Phosphatase: 44 U/L (ref 38–126)
BUN: 18 mg/dL (ref 6–20)
CO2: 29 mmol/L (ref 22–32)
Calcium: 9.8 mg/dL (ref 8.9–10.3)
Chloride: 105 mmol/L (ref 98–111)
Creatinine: 0.83 mg/dL (ref 0.44–1.00)
GFR, Est AFR Am: >60 mL/min (ref >60)
GFR, Estimated: >60 mL/min (ref >60)
Glucose, Bld: 93 mg/dL (ref 70–99)
Potassium: 3.8 mmol/L (ref 3.5–5.1)
Sodium: 142 mmol/L (ref 135–145)
Total Bilirubin: 0.2 mg/dL — ABNORMAL LOW (ref 0.3–1.2)
Total Protein: 7.9 g/dL (ref 6.5–8.1)

## 2018-07-15 LAB — LACTATE DEHYDROGENASE: LDH: 177 U/L (ref 98–192)

## 2018-07-15 LAB — IRON AND TIBC
Iron: 30 ug/dL — ABNORMAL LOW (ref 41–142)
Saturation Ratios: 7 % — ABNORMAL LOW (ref 21–57)
TIBC: 439 ug/dL (ref 236–444)
UIBC: 409 ug/dL — ABNORMAL HIGH (ref 120–384)

## 2018-07-15 LAB — FERRITIN: Ferritin: <4 ng/mL — ABNORMAL LOW (ref 11–307)

## 2018-07-15 LAB — SAVE SMEAR(SSMR), FOR PROVIDER SLIDE REVIEW

## 2018-07-15 MED ORDER — SODIUM CHLORIDE 0.9 % IV SOLN
510.0000 mg | Freq: Once | INTRAVENOUS | Status: AC
  Administered 2018-07-15: 510 mg via INTRAVENOUS
  Filled 2018-07-15: qty 17

## 2018-07-15 MED ORDER — SODIUM CHLORIDE 0.9 % IV SOLN
Freq: Once | INTRAVENOUS | Status: AC
  Administered 2018-07-15: 10:00:00 via INTRAVENOUS
  Filled 2018-07-15: qty 250

## 2018-07-15 NOTE — Progress Notes (Signed)
Okay to receive Feraheme today per Merla Riches.

## 2018-07-15 NOTE — Patient Instructions (Signed)

## 2018-07-22 ENCOUNTER — Other Ambulatory Visit: Payer: Self-pay

## 2018-07-22 ENCOUNTER — Inpatient Hospital Stay: Payer: 59 | Attending: Hematology

## 2018-07-22 VITALS — BP 115/61 | HR 69 | Temp 98.5°F | Resp 18

## 2018-07-22 DIAGNOSIS — N920 Excessive and frequent menstruation with regular cycle: Secondary | ICD-10-CM | POA: Diagnosis present

## 2018-07-22 DIAGNOSIS — D500 Iron deficiency anemia secondary to blood loss (chronic): Secondary | ICD-10-CM | POA: Insufficient documentation

## 2018-07-22 MED ORDER — SODIUM CHLORIDE 0.9 % IV SOLN
Freq: Once | INTRAVENOUS | Status: AC
  Administered 2018-07-22: 10:00:00 via INTRAVENOUS
  Filled 2018-07-22: qty 250

## 2018-07-22 MED ORDER — SODIUM CHLORIDE 0.9 % IV SOLN
510.0000 mg | Freq: Once | INTRAVENOUS | Status: AC
  Administered 2018-07-22: 510 mg via INTRAVENOUS
  Filled 2018-07-22: qty 17

## 2018-07-22 NOTE — Patient Instructions (Signed)

## 2018-08-31 ENCOUNTER — Telehealth: Payer: Self-pay | Admitting: Hematology

## 2018-08-31 NOTE — Telephone Encounter (Signed)
Appts rescheduled due to provider PAL/letter/calendar mailed  °

## 2018-09-16 ENCOUNTER — Other Ambulatory Visit: Payer: 59

## 2018-09-16 ENCOUNTER — Ambulatory Visit: Payer: 59 | Admitting: Hematology

## 2018-09-22 ENCOUNTER — Other Ambulatory Visit: Payer: 59

## 2018-09-22 ENCOUNTER — Ambulatory Visit: Payer: 59 | Admitting: Hematology

## 2018-09-22 ENCOUNTER — Telehealth: Payer: Self-pay | Admitting: Obstetrics and Gynecology

## 2018-09-22 DIAGNOSIS — D649 Anemia, unspecified: Secondary | ICD-10-CM

## 2018-09-22 NOTE — Telephone Encounter (Signed)
This patient has significant iron def anemia. She cancelled her hematology appointment. Can you please call and speak with her, let her know that I'm worried about her and really think she needs to see the Hematologist.

## 2018-09-23 NOTE — Telephone Encounter (Signed)
Spoke with patient. Advised as seen below per Dr. Oscar La. Patient states she received iron infusions on 3/27 and 07/22/18, cancelled her f/u visit due to the out of pocket cost, "bills were adding up". Reports menses is not as heavy or as painful. "Does not feel as cold as before infusions". Has been taking OTC iron supplements "most days". LMP 09/01/18. Denies SHOB, weakness, lightheadedness, dizziness, increased heart rate. Offered assistance with rescheduling appointment, patient declined. Encouraged patient to contact hematology to reschedule OV and to speak with business office if she has concerns about billing. Patient verbalizes understanding and is agreeable. Advised I will update Dr. Oscar La and return call if any additional recommendations.   Routing to Dr. Oscar La.

## 2018-09-26 NOTE — Telephone Encounter (Signed)
I understand her financial concerns, I'm just worried because she had a significant iron deficient anemia. Would she be willing to come here for a CBC, ferritin? If so please order. I know Dr Juleen China will also arrange for iron transfusions.

## 2018-09-27 ENCOUNTER — Other Ambulatory Visit: Payer: Self-pay

## 2018-09-27 NOTE — Telephone Encounter (Signed)
Spoke with patient, advised as seen below per Dr. Talbert Nan. Patient is agreeable to scheduling lab appt at [facility], appt scheduled for 6/10 at 10:30am.   Future lab orders placed for CBC and Ferritin.   Dr. Talbert Nan -any additional orders? OK to close encounter?

## 2018-09-27 NOTE — Telephone Encounter (Signed)
Will close encounter

## 2018-09-28 ENCOUNTER — Other Ambulatory Visit (INDEPENDENT_AMBULATORY_CARE_PROVIDER_SITE_OTHER): Payer: 59

## 2018-09-28 DIAGNOSIS — D649 Anemia, unspecified: Secondary | ICD-10-CM

## 2018-09-29 LAB — CBC
Hematocrit: 40 % (ref 34.0–46.6)
Hemoglobin: 13 g/dL (ref 11.1–15.9)
MCH: 31.7 pg (ref 26.6–33.0)
MCHC: 32.5 g/dL (ref 31.5–35.7)
MCV: 98 fL — ABNORMAL HIGH (ref 79–97)
Platelets: 212 10*3/uL (ref 150–450)
RBC: 4.1 x10E6/uL (ref 3.77–5.28)
RDW: 15.8 % — ABNORMAL HIGH (ref 11.7–15.4)
WBC: 5.6 10*3/uL (ref 3.4–10.8)

## 2018-09-29 LAB — FERRITIN: Ferritin: 146 ng/mL (ref 15–150)

## 2018-10-03 ENCOUNTER — Telehealth: Payer: Self-pay

## 2018-10-03 NOTE — Telephone Encounter (Signed)
Spoke with patient. Results given. Patient verbalizes understanding. Patient has not been taking iron consistently. Is unsure of the dosage. Advised to take ferrous sulfate 150 daily. Patient verbalizes understanding.  Routing to provider and will close encounter.

## 2018-10-03 NOTE — Telephone Encounter (Signed)
-----   Message from Salvadore Dom, MD sent at 09/30/2018  1:44 PM EDT ----- Please let the patient know that she is no longer anemic. Check if she is still taking daily iron and if so how much? Given her prior significant iron def anemia, she should continue on a minimum of 1 iron tablet a day.

## 2018-10-03 NOTE — Telephone Encounter (Signed)
Attempted to reach patient at number provided. There was no answer and voicemail box is full. 

## 2019-02-20 ENCOUNTER — Ambulatory Visit: Payer: 59 | Admitting: Obstetrics and Gynecology

## 2019-02-27 ENCOUNTER — Other Ambulatory Visit: Payer: Self-pay

## 2019-02-27 NOTE — Progress Notes (Signed)
GYNECOLOGY  VISIT   HPI: 23 y.o.   Single Black or African American Not Hispanic or Latino  female   G1P0010 with Patient's last menstrual period was 02/19/2019 (exact date).   here for 2 week h/o vaginal discharge, cottage cheese, white, vaginal odor. No itching or irritation. Symptoms are a little better currently.     GYNECOLOGIC HISTORY: Patient's last menstrual period was 02/19/2019 (exact date). Contraception: none Menopausal hormone therapy: NA        OB History    Gravida  1   Para      Term      Preterm      AB  1   Living        SAB  1   TAB      Ectopic      Multiple      Live Births                 Patient Active Problem List   Diagnosis Date Noted  . Menorrhagia 07/15/2018  . Eczematous dermatitis of upper eyelids of both eyes 07/03/2018  . Iron deficiency anemia due to chronic blood loss 06/01/2017  . Palpitations 06/01/2017    Past Medical History:  Diagnosis Date  . Alopecia   . Anemia   . Anxiety   . Depression   . Dysmenorrhea   . STD (sexually transmitted disease), trichomonas 01/2017    History reviewed. No pertinent surgical history.  Current Outpatient Medications  Medication Sig Dispense Refill  . fluocinolone (SYNALAR) 0.01 % external solution Apply topically 2 (two) times daily.    Marland Kitchen ibuprofen (ADVIL,MOTRIN) 800 MG tablet Take 1 tablet (800 mg total) by mouth every 8 (eight) hours as needed. 30 tablet 1  . tacrolimus (PROTOPIC) 0.1 % ointment Apply topically 2 (two) times daily.     No current facility-administered medications for this visit.      ALLERGIES: Patient has no known allergies.  Family History  Problem Relation Age of Onset  . Multiple sclerosis Mother   . Hypertension Father   . Diabetes Paternal Grandmother   . Hypertension Paternal Grandmother   . Stroke Paternal Grandmother     Social History   Socioeconomic History  . Marital status: Single    Spouse name: Not on file  . Number of children:  Not on file  . Years of education: Not on file  . Highest education level: Not on file  Occupational History  . Not on file  Social Needs  . Financial resource strain: Not on file  . Food insecurity    Worry: Not on file    Inability: Not on file  . Transportation needs    Medical: Not on file    Non-medical: Not on file  Tobacco Use  . Smoking status: Never Smoker  . Smokeless tobacco: Never Used  Substance and Sexual Activity  . Alcohol use: Yes    Alcohol/week: 1.0 standard drinks    Types: 1 Standard drinks or equivalent per week  . Drug use: Yes    Types: Marijuana  . Sexual activity: Yes    Partners: Male    Birth control/protection: None  Lifestyle  . Physical activity    Days per week: Not on file    Minutes per session: Not on file  . Stress: Not on file  Relationships  . Social Musician on phone: Not on file    Gets together: Not on file  Attends religious service: Not on file    Active member of club or organization: Not on file    Attends meetings of clubs or organizations: Not on file    Relationship status: Not on file  . Intimate partner violence    Fear of current or ex partner: Not on file    Emotionally abused: Not on file    Physically abused: Not on file    Forced sexual activity: Not on file  Other Topics Concern  . Not on file  Social History Narrative  . Not on file    Review of Systems  Constitutional: Negative.   HENT: Negative.   Eyes: Negative.   Respiratory: Negative.   Cardiovascular: Negative.   Gastrointestinal: Negative.   Genitourinary:       Vaginal discharge Vaginal odor  Musculoskeletal: Negative.   Skin: Negative.   Neurological: Negative.   Endo/Heme/Allergies: Negative.   Psychiatric/Behavioral: Negative.     PHYSICAL EXAMINATION:    BP 110/62 (BP Location: Right Arm, Patient Position: Sitting, Cuff Size: Normal)   Pulse 80   Temp (!) 97.1 F (36.2 C) (Skin)   Wt 138 lb 6.4 oz (62.8 kg)   LMP  02/19/2019 (Exact Date)   BMI 27.95 kg/m     General appearance: alert, cooperative and appears stated age  Pelvic: External genitalia:  no lesions              Urethra:  normal appearing urethra with no masses, tenderness or lesions              Bartholins and Skenes: normal                 Vagina: normal appearing vagina with an increase in thick, white vaginal d/c              Cervix: no lesions  Chaperone was present for exam.  Wet prep: + clue, no trich, few wbc KOH: no yeast PH: 5   ASSESSMENT Vaginal discharge and odor. Vaginal d/c is thick, not clumpy, clue cells on vaginal slides.    PLAN Treat for BV with oral flagyl (no ETOH) Will send affirm to r/o yeast   An After Visit Summary was printed and given to the patient.  ~15 minutes face to face time of which over 50% was spent in counseling.

## 2019-02-28 ENCOUNTER — Ambulatory Visit (INDEPENDENT_AMBULATORY_CARE_PROVIDER_SITE_OTHER): Payer: 59 | Admitting: Obstetrics and Gynecology

## 2019-02-28 ENCOUNTER — Telehealth: Payer: Self-pay | Admitting: Obstetrics and Gynecology

## 2019-02-28 ENCOUNTER — Encounter: Payer: Self-pay | Admitting: Obstetrics and Gynecology

## 2019-02-28 VITALS — BP 110/62 | HR 80 | Temp 97.1°F | Wt 138.4 lb

## 2019-02-28 DIAGNOSIS — N760 Acute vaginitis: Secondary | ICD-10-CM

## 2019-02-28 DIAGNOSIS — B9689 Other specified bacterial agents as the cause of diseases classified elsewhere: Secondary | ICD-10-CM | POA: Diagnosis not present

## 2019-02-28 MED ORDER — METRONIDAZOLE 500 MG PO TABS: 500.0000 mg | ORAL_TABLET | Freq: Two times a day (BID) | ORAL | 0 refills | Status: DC

## 2019-02-28 NOTE — Telephone Encounter (Signed)
Spoke with pt. Pt questions answered. Pt educated on not needing partner therapy for BV and reinforced about no alcohol or intercourse while during treatment of BV. Pt verbalized understanding.  Routing to provider for final review. Patient is agreeable to disposition. Will close encounter.

## 2019-02-28 NOTE — Telephone Encounter (Signed)
Patient is being treated for BV. Wants to make sure her sexual partner does not need any sort of treatment as well.

## 2019-02-28 NOTE — Patient Instructions (Signed)
Vaginitis Vaginitis is a condition in which the vaginal tissue swells and becomes red (inflamed). This condition is most often caused by a change in the normal balance of bacteria and yeast that live in the vagina. This change causes an overgrowth of certain bacteria or yeast, which causes the inflammation. There are different types of vaginitis, but the most common types are:  Bacterial vaginosis.  Yeast infection (candidiasis).  Trichomoniasis vaginitis. This is a sexually transmitted disease (STD).  Viral vaginitis.  Atrophic vaginitis.  Allergic vaginitis. What are the causes? The cause of this condition depends on the type of vaginitis. It can be caused by:  Bacteria (bacterial vaginosis).  Yeast, which is a fungus (yeast infection).  A parasite (trichomoniasis vaginitis).  A virus (viral vaginitis).  Low hormone levels (atrophic vaginitis). Low hormone levels can occur during pregnancy, breastfeeding, or after menopause.  Irritants, such as bubble baths, scented tampons, and feminine sprays (allergic vaginitis). Other factors can change the normal balance of the yeast and bacteria that live in the vagina. These include:  Antibiotic medicines.  Poor hygiene.  Diaphragms, vaginal sponges, spermicides, birth control pills, and intrauterine devices (IUD).  Sex.  Infection.  Uncontrolled diabetes.  A weakened defense (immune) system. What increases the risk? This condition is more likely to develop in women who:  Smoke.  Use vaginal douches, scented tampons, or scented sanitary pads.  Wear tight-fitting pants.  Wear thong underwear.  Use oral birth control pills or an IUD.  Have sex without a condom.  Have multiple sex partners.  Have an STD.  Frequently use the spermicide nonoxynol-9.  Eat lots of foods high in sugar.  Have uncontrolled diabetes.  Have low estrogen levels.  Have a weakened immune system from an immune disorder or medical  treatment.  Are pregnant or breastfeeding. What are the signs or symptoms? Symptoms vary depending on the cause of the vaginitis. Common symptoms include:  Abnormal vaginal discharge. ? The discharge is white, gray, or yellow with bacterial vaginosis. ? The discharge is thick, white, and cheesy with a yeast infection. ? The discharge is frothy and yellow or greenish with trichomoniasis.  A bad vaginal smell. The smell is fishy with bacterial vaginosis.  Vaginal itching, pain, or swelling.  Sex that is painful.  Pain or burning when urinating. Sometimes there are no symptoms. How is this diagnosed? This condition is diagnosed based on your symptoms and medical history. A physical exam, including a pelvic exam, will also be done. You may also have other tests, including:  Tests to determine the pH level (acidity or alkalinity) of your vagina.  A whiff test, to assess the odor that results when a sample of your vaginal discharge is mixed with a potassium hydroxide solution.  Tests of vaginal fluid. A sample will be examined under a microscope. How is this treated? Treatment varies depending on the type of vaginitis you have. Your treatment may include:  Antibiotic creams or pills to treat bacterial vaginosis and trichomoniasis.  Antifungal medicines, such as vaginal creams or suppositories, to treat a yeast infection.  Medicine to ease discomfort if you have viral vaginitis. Your sexual partner should also be treated.  Estrogen delivered in a cream, pill, suppository, or vaginal ring to treat atrophic vaginitis. If vaginal dryness occurs, lubricants and moisturizing creams may help. You may need to avoid scented soaps, sprays, or douches.  Stopping use of a product that is causing allergic vaginitis. Then using a vaginal cream to treat the symptoms. Follow   these instructions at home: Lifestyle  Keep your genital area clean and dry. Avoid soap, and only rinse the area with  water.  Do not douche or use tampons until your health care provider says it is okay to do so. Use sanitary pads, if needed.  Do not have sex until your health care provider approves. When you can return to sex, practice safe sex and use condoms.  Wipe from front to back. This avoids the spread of bacteria from the rectum to the vagina. General instructions  Take over-the-counter and prescription medicines only as told by your health care provider.  If you were prescribed an antibiotic medicine, take or use it as told by your health care provider. Do not stop taking or using the antibiotic even if you start to feel better.  Keep all follow-up visits as told by your health care provider. This is important. How is this prevented?  Use mild, non-scented products. Do not use things that can irritate the vagina, such as fabric softeners. Avoid the following products if they are scented: ? Feminine sprays. ? Detergents. ? Tampons. ? Feminine hygiene products. ? Soaps or bubble baths.  Let air reach your genital area. ? Wear cotton underwear to reduce moisture buildup. ? Avoid wearing underwear while you sleep. ? Avoid wearing tight pants and underwear or nylons without a cotton panel. ? Avoid wearing thong underwear.  Take off any wet clothing, such as bathing suits, as soon as possible.  Practice safe sex and use condoms. Contact a health care provider if:  You have abdominal pain.  You have a fever.  You have symptoms that last for more than 2-3 days. Get help right away if:  You have a fever and your symptoms suddenly get worse. Summary  Vaginitis is a condition in which the vaginal tissue becomes inflamed.This condition is most often caused by a change in the normal balance of bacteria and yeast that live in the vagina.  Treatment varies depending on the type of vaginitis you have.  Do not douche, use tampons , or have sex until your health care provider approves. When  you can return to sex, practice safe sex and use condoms. This information is not intended to replace advice given to you by your health care provider. Make sure you discuss any questions you have with your health care provider. Document Released: 02/01/2007 Document Revised: 03/19/2017 Document Reviewed: 05/12/2016 Elsevier Patient Education  2020 [employer] Inc[employer]  

## 2019-03-01 LAB — VAGINITIS/VAGINOSIS, DNA PROBE
Candida Species: NEGATIVE
Gardnerella vaginalis: POSITIVE — AB
Trichomonas vaginosis: NEGATIVE

## 2019-06-12 NOTE — Progress Notes (Deleted)
24 y.o. G1P0010 Single Black or African American Not Hispanic or Latino female here for annual exam.      No LMP recorded.          Sexually active: {yes no:314532}  The current method of family planning is {contraception:315051}.    Exercising: {yes no:314532}  {types:19826} Smoker:  {YES [id number]  Health Maintenance: Pap:  05/02/2017 WNL History of abnormal Pap:  no TDaP:  *** Gardasil: ***   reports that she has never smoked. She has never used smokeless tobacco. She reports current alcohol use of about 1.0 standard drinks of alcohol per week. She reports current drug use. Drug: Marijuana.  Past Medical History:  Diagnosis Date  . Alopecia   . Anemia   . Anxiety   . Depression   . Dysmenorrhea   . STD (sexually transmitted disease), trichomonas 01/2017    No past surgical history on file.  Current Outpatient Medications  Medication Sig Dispense Refill  . fluocinolone (SYNALAR) 0.01 % external solution Apply topically 2 (two) times daily.    Marland Kitchen ibuprofen (ADVIL,MOTRIN) 800 MG tablet Take 1 tablet (800 mg total) by mouth every 8 (eight) hours as needed. 30 tablet 1  . metroNIDAZOLE (FLAGYL) 500 MG tablet Take 1 tablet (500 mg total) by mouth 2 (two) times daily. 14 tablet 0  . tacrolimus (PROTOPIC) 0.1 % ointment Apply topically 2 (two) times daily.     No current facility-administered medications for this visit.    Family History  Problem Relation Age of Onset  . Multiple sclerosis Mother   . Hypertension Father   . Diabetes Paternal Grandmother   . Hypertension Paternal Grandmother   . Stroke Paternal Grandmother     Review of Systems  Exam:   There were no vitals taken for this visit.  Weight change: @WEIGHTCHANGE @ Height:      Ht Readings from Last 3 Encounters:  07/15/18 4\' 11"  (1.499 m)  06/28/18 4\' 11"  (1.499 m)  06/07/18 4\' 11"  (1.499 m)    General appearance: alert, cooperative and appears stated age Head: Normocephalic, without obvious abnormality,  atraumatic Neck: no adenopathy, supple, symmetrical, trachea midline and thyroid {CHL AMB PHY EX THYROID NORM DEFAULT:[phone]::"normal to inspection and palpation"} Lungs: clear to auscultation bilaterally Cardiovascular: regular rate and rhythm Breasts: {Exam; breast:13139::"normal appearance, no masses or tenderness"} Abdomen: soft, non-tender; non distended,  no masses,  no organomegaly Extremities: extremities normal, atraumatic, no cyanosis or edema Skin: Skin color, texture, turgor normal. No rashes or lesions Lymph nodes: Cervical, supraclavicular, and axillary nodes normal. No abnormal inguinal nodes palpated Neurologic: Grossly normal   Pelvic: External genitalia:  no lesions              Urethra:  normal appearing urethra with no masses, tenderness or lesions              Bartholins and Skenes: normal                 Vagina: normal appearing vagina with normal color and discharge, no lesions              Cervix: {CHL AMB PHY EX CERVIX NORM [phone]::"no lesions"}               Bimanual Exam:  Uterus:  {CHL AMB PHY EX UTERUS NORM DEFAULT:[phone]::"normal size, contour, position, consistency, mobility, non-tender"}              Adnexa: {CHL AMB PHY EX ADNEXA NO MASS DEFAULT:[phone]::"no mass, fullness, tenderness"}  Rectovaginal: Confirms               Anus:  normal sphincter tone, no lesions  *** chaperoned for the exam.  A:  Well Woman with normal exam  P:

## 2019-06-14 ENCOUNTER — Other Ambulatory Visit: Payer: Self-pay

## 2019-06-15 ENCOUNTER — Other Ambulatory Visit: Payer: Self-pay | Admitting: Obstetrics and Gynecology

## 2019-06-15 ENCOUNTER — Ambulatory Visit: Payer: 59 | Admitting: Obstetrics and Gynecology

## 2019-06-15 ENCOUNTER — Telehealth: Payer: Self-pay | Admitting: Obstetrics and Gynecology

## 2019-06-15 DIAGNOSIS — Z01419 Encounter for gynecological examination (general) (routine) without abnormal findings: Secondary | ICD-10-CM

## 2019-06-15 MED ORDER — IBUPROFEN 800 MG PO TABS: 800.0000 mg | ORAL_TABLET | Freq: Three times a day (TID) | ORAL | 1 refills | Status: DC | PRN

## 2019-06-15 NOTE — Telephone Encounter (Signed)
Patient cancelled/rescheduled today's appointment due to her cycle starting. Rescheduled to 07/19/19 per patient.

## 2019-06-15 NOTE — Telephone Encounter (Signed)
Med refill request: Ibuprofen Last AEX: 06/07/2018 with JJ Next AEX: 07/19/2019  Last MMG (if hormonal med) na Refill authorized: # [id number],1 RF pended if approved.

## 2019-06-15 NOTE — Telephone Encounter (Signed)
Patient needs refill on 800 mg ibuprofen sent to pharmacy on file.

## 2019-07-03 ENCOUNTER — Other Ambulatory Visit: Payer: Self-pay | Admitting: Family

## 2019-07-03 ENCOUNTER — Encounter: Payer: Self-pay | Admitting: Certified Nurse Midwife

## 2019-07-05 ENCOUNTER — Encounter: Payer: Self-pay | Admitting: Certified Nurse Midwife

## 2019-07-18 NOTE — Progress Notes (Signed)
24 y.o. G1P0010 Single Black or African American Not Hispanic or Latino female here for annual exam. Patient states that she had spotting between periods recently.  She has a h/o iron def anemia secondary to menorrhagia. She has received iron transfusions, last in 4/20.  Period Cycle (Days): 30 Period Duration (Days): 5 Period Pattern: Regular Menstrual Flow: Moderate Menstrual Control: Maxi pad Menstrual Control Change Freq (Hours): 2 Dysmenorrhea: (!) Moderate Dysmenorrhea Symptoms: Cramping, Diarrhea, Nausea(back pain)  In the first 2 days of her cycle she changes her pad every 2 hours, the pad isn't full but uncomfortable. Lighter than it used to be, not as painful. She c/o 3 days of spotting in her last cycle from 3/9-3/11. Sexually active, same partner x ~2 years, using condoms sometime. Not interested in any other contraception. No dyspareunia.   Patient's last menstrual period was 07/16/2019.          Sexually active: Yes.    The current method of family planning is condoms sometimes.    Exercising: No.  The patient does not participate in regular exercise at present. Smoker:  no  Health Maintenance: Pap:  05/02/17 Normal  History of abnormal Pap:  no* TDaP:  unsure Gardasil: never She has grown up without getting any vaccinations.    reports that she has never smoked. She has never used smokeless tobacco. She reports current alcohol use of about 1.0 standard drinks of alcohol per week. She reports current drug use. Drug: Marijuana. 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 Looking for a new job.   Past Medical History:  Diagnosis Date  . Alopecia   . Anemia   . Anxiety   . Depression   . Dysmenorrhea   . STD (sexually transmitted disease), trichomonas 01/2017  no current depression/anxiety  History reviewed. No pertinent surgical history.  Current Outpatient Medications  Medication Sig Dispense Refill  . fluocinolone (SYNALAR) 0.01 %  external solution Apply topically 2 (two) times daily.    Marland Kitchen ibuprofen (ADVIL) 800 MG tablet Take 1 tablet (800 mg total) by mouth every 8 (eight) hours as needed. 30 tablet 1  . tacrolimus (PROTOPIC) 0.1 % ointment Apply topically 2 (two) times daily.     No current facility-administered medications for this visit.    Family History  Problem Relation Age of Onset  . Multiple sclerosis Mother   . Hypertension Father   . Diabetes Paternal Grandmother   . Hypertension Paternal Grandmother   . Stroke Paternal Grandmother     Review of Systems  All other systems reviewed and are negative.   Exam:   BP 116/60 (BP Location: Right Arm, Patient Position: Sitting, Cuff Size: Normal)   Pulse 84   Temp (!) 97.5 F (36.4 C) (Temporal)   Resp 10   Ht 4\' 11"  (1.499 m)   Wt 142 lb (64.4 kg)   LMP 07/16/2019   BMI 28.68 kg/m   Weight change: @WEIGHTCHANGE @ Height:   Height: 4\' 11"  (149.9 cm)  Ht Readings from Last 3 Encounters:  07/19/19 4\' 11"  (1.499 m)  07/15/18 4\' 11"  (1.499 m)  06/28/18 4\' 11"  (1.499 m)    General appearance: alert, cooperative and appears stated age Head: Normocephalic, without obvious abnormality, atraumatic Neck: no adenopathy, supple, symmetrical, trachea midline and thyroid normal to inspection and palpation Lungs: clear to auscultation bilaterally Cardiovascular: regular rate and rhythm Breasts: normal appearance, no masses or tenderness Abdomen: soft, non-tender; non distended,  no masses,  no  organomegaly Extremities: extremities normal, atraumatic, no cyanosis or edema Skin: Skin color, texture, turgor normal. No rashes or lesions Lymph nodes: Cervical, supraclavicular, and axillary nodes normal. No abnormal inguinal nodes palpated Neurologic: Grossly normal   Pelvic: External genitalia:  no lesions              Urethra:  normal appearing urethra with no masses, tenderness or lesions              Bartholins and Skenes: normal                 Vagina:  normal appearing vagina with normal color and discharge, no lesions              Cervix: no lesions               Bimanual Exam:  Uterus:  normal size, contour, position, consistency, mobility, non-tender              Adnexa: no mass, fullness, tenderness               Rectovaginal: Confirms               Anus:  normal sphincter tone, no lesions  Gae Dry chaperoned for the exam.  A:  Well Woman with normal exam  H/O menorrhagia leading to anemia, she feels her cycles are lighter  Contraception, happy with condoms.   Vit d def in her diet  P:   Pap next year  Screening labs, Ferritin, vit d  STD testing  Discussed gardasil, information given  Discussed breast self exam  Discussed calcium and vit D intake

## 2019-07-19 ENCOUNTER — Other Ambulatory Visit: Payer: Self-pay

## 2019-07-19 ENCOUNTER — Ambulatory Visit (INDEPENDENT_AMBULATORY_CARE_PROVIDER_SITE_OTHER): Payer: 59 | Admitting: Obstetrics and Gynecology

## 2019-07-19 ENCOUNTER — Encounter: Payer: Self-pay | Admitting: Obstetrics and Gynecology

## 2019-07-19 VITALS — BP 116/60 | HR 84 | Temp 97.5°F | Resp 10 | Ht 59.0 in | Wt 142.0 lb

## 2019-07-19 DIAGNOSIS — Z113 Encounter for screening for infections with a predominantly sexual mode of transmission: Secondary | ICD-10-CM

## 2019-07-19 DIAGNOSIS — Z0000 Encounter for general adult medical examination without abnormal findings: Secondary | ICD-10-CM | POA: Diagnosis not present

## 2019-07-19 DIAGNOSIS — E559 Vitamin D deficiency, unspecified: Secondary | ICD-10-CM

## 2019-07-19 DIAGNOSIS — Z862 Personal history of diseases of the blood and blood-forming organs and certain disorders involving the immune mechanism: Secondary | ICD-10-CM | POA: Diagnosis not present

## 2019-07-19 DIAGNOSIS — Z01419 Encounter for gynecological examination (general) (routine) without abnormal findings: Secondary | ICD-10-CM

## 2019-07-19 NOTE — Patient Instructions (Signed)
EXERCISE AND DIET:  We recommended that you start or continue a regular exercise program for good health. Regular exercise means any activity that makes your heart beat faster and makes you sweat.  We recommend exercising at least 30 minutes per day at least 3 days a week, preferably 4 or 5.  We also recommend a diet low in fat and sugar.  Inactivity, poor dietary choices and obesity can cause diabetes, heart attack, stroke, and kidney damage, among others.    ALCOHOL AND SMOKING:  Women should limit their alcohol intake to no more than 7 drinks/beers/glasses of wine (combined, not each!) per week. Moderation of alcohol intake to this level decreases your risk of breast cancer and liver damage. And of course, no recreational drugs are part of a healthy lifestyle.  And absolutely no smoking or even second hand smoke. Most people know smoking can cause heart and lung diseases, but did you know it also contributes to weakening of your bones? Aging of your skin?  Yellowing of your teeth and nails?  CALCIUM AND VITAMIN D:  Adequate intake of calcium and Vitamin D are recommended.  The recommendations for exact amounts of these supplements seem to change often, but generally speaking 1,000 mg of calcium (between diet and supplement) and 800 units of Vitamin D per day seems prudent. Certain women may benefit from higher intake of Vitamin D.  If you are among these women, your doctor will have told you during your visit.    PAP SMEARS:  Pap smears, to check for cervical cancer or precancers,  have traditionally been done yearly, although recent scientific advances have shown that most women can have pap smears less often.  However, every woman still should have a physical exam from her gynecologist every year. It will include a breast check, inspection of the vulva and vagina to check for abnormal growths or skin changes, a visual exam of the cervix, and then an exam to evaluate the size and shape of the uterus and  ovaries.  And after 25 years of age, a rectal exam is indicated to check for rectal cancers. We will also provide age appropriate advice regarding health maintenance, like when you should have certain vaccines, screening for sexually transmitted diseases, bone density testing, colonoscopy, mammograms, etc.   MAMMOGRAMS:  All women over 40 years old should have a yearly mammogram. Many facilities now offer a "3D" mammogram, which may cost around $50 extra out of pocket. If possible,  we recommend you accept the option to have the 3D mammogram performed.  It both reduces the number of women who will be called back for extra views which then turn out to be normal, and it is better than the routine mammogram at detecting truly abnormal areas.    COLON CANCER SCREENING: Now recommend starting at age 45. At this time colonoscopy is not covered for routine screening until 50. There are take home tests that can be done between 45-49.   COLONOSCOPY:  Colonoscopy to screen for colon cancer is recommended for all women at age 50.  We know, you hate the idea of the prep.  We agree, BUT, having colon cancer and not knowing it is worse!!  Colon cancer so often starts as a polyp that can be seen and removed at colonscopy, which can quite literally save your life!  And if your first colonoscopy is normal and you have no family history of colon cancer, most women don't have to have it again for   10 years.  Once every ten years, you can do something that may end up saving your life, right?  We will be happy to help you get it scheduled when you are ready.  Be sure to check your insurance coverage so you understand how much it will cost.  It may be covered as a preventative service at no cost, but you should check your particular policy.      Breast Self-Awareness Breast self-awareness means being familiar with how your breasts look and feel. It involves checking your breasts regularly and reporting any changes to your  health care provider. Practicing breast self-awareness is important. A change in your breasts can be a sign of a serious medical problem. Being familiar with how your breasts look and feel allows you to find any problems early, when treatment is more likely to be successful. All women should practice breast self-awareness, including women who have had breast implants. How to do a breast self-exam One way to learn what is normal for your breasts and whether your breasts are changing is to do a breast self-exam. To do a breast self-exam: Look for Changes  1. Remove all the clothing above your waist. 2. Stand in front of a mirror in a room with good lighting. 3. Put your hands on your hips. 4. Push your hands firmly downward. 5. Compare your breasts in the mirror. Look for differences between them (asymmetry), such as: ? Differences in shape. ? Differences in size. ? Puckers, dips, and bumps in one breast and not the other. 6. Look at each breast for changes in your skin, such as: ? Redness. ? Scaly areas. 7. Look for changes in your nipples, such as: ? Discharge. ? Bleeding. ? Dimpling. ? Redness. ? A change in position. Feel for Changes Carefully feel your breasts for lumps and changes. It is best to do this while lying on your back on the floor and again while sitting or standing in the shower or tub with soapy water on your skin. Feel each breast in the following way:  Place the arm on the side of the breast you are examining above your head.  Feel your breast with the other hand.  Start in the nipple area and make  inch (2 cm) overlapping circles to feel your breast. Use the pads of your three middle fingers to do this. Apply light pressure, then medium pressure, then firm pressure. The light pressure will allow you to feel the tissue closest to the skin. The medium pressure will allow you to feel the tissue that is a little deeper. The firm pressure will allow you to feel the tissue  close to the ribs.  Continue the overlapping circles, moving downward over the breast until you feel your ribs below your breast.  Move one finger-width toward the center of the body. Continue to use the  inch (2 cm) overlapping circles to feel your breast as you move slowly up toward your collarbone.  Continue the up and down exam using all three pressures until you reach your armpit.  Write Down What You Find  Write down what is normal for each breast and any changes that you find. Keep a written record with breast changes or normal findings for each breast. By writing this information down, you do not need to depend only on memory for size, tenderness, or location. Write down where you are in your menstrual cycle, if you are still menstruating. If you are having trouble noticing differences   in your breasts, do not get discouraged. With time you will become more familiar with the variations in your breasts and more comfortable with the exam. How often should I examine my breasts? Examine your breasts every month. If you are breastfeeding, the best time to examine your breasts is after a feeding or after using a breast pump. If you menstruate, the best time to examine your breasts is 5-7 days after your period is over. During your period, your breasts are lumpier, and it may be more difficult to notice changes. When should I see my health care provider? See your health care provider if you notice:  A change in shape or size of your breasts or nipples.  A change in the skin of your breast or nipples, such as a reddened or scaly area.  Unusual discharge from your nipples.  A lump or thick area that was not there before.  Pain in your breasts.  Anything that concerns you.  

## 2019-07-20 LAB — COMPREHENSIVE METABOLIC PANEL
ALT: 7 IU/L (ref 0–32)
AST: 17 IU/L (ref 0–40)
Albumin/Globulin Ratio: 1.7 (ref 1.2–2.2)
Albumin: 4.7 g/dL (ref 3.9–5.0)
Alkaline Phosphatase: 49 IU/L (ref 39–117)
BUN/Creatinine Ratio: 20 (ref 9–23)
BUN: 17 mg/dL (ref 6–20)
Bilirubin Total: <0.2 mg/dL (ref 0.0–1.2)
CO2: 22 mmol/L (ref 20–29)
Calcium: 9.7 mg/dL (ref 8.7–10.2)
Chloride: 102 mmol/L (ref 96–106)
Creatinine, Ser: 0.85 mg/dL (ref 0.57–1.00)
GFR calc Af Amer: 110 mL/min/{1.73_m2} (ref >59)
GFR calc non Af Amer: 96 mL/min/{1.73_m2} (ref >59)
Globulin, Total: 2.8 g/dL (ref 1.5–4.5)
Glucose: 86 mg/dL (ref 65–99)
Potassium: 4.2 mmol/L (ref 3.5–5.2)
Sodium: 141 mmol/L (ref 134–144)
Total Protein: 7.5 g/dL (ref 6.0–8.5)

## 2019-07-20 LAB — CBC
Hematocrit: 37.8 % (ref 34.0–46.6)
Hemoglobin: 12.1 g/dL (ref 11.1–15.9)
MCH: 31.3 pg (ref 26.6–33.0)
MCHC: 32 g/dL (ref 31.5–35.7)
MCV: 98 fL — ABNORMAL HIGH (ref 79–97)
Platelets: 290 10*3/uL (ref 150–450)
RBC: 3.86 x10E6/uL (ref 3.77–5.28)
RDW: 12.4 % (ref 11.7–15.4)
WBC: 5.1 10*3/uL (ref 3.4–10.8)

## 2019-07-20 LAB — LIPID PANEL
Chol/HDL Ratio: 3.4 ratio (ref 0.0–4.4)
Cholesterol, Total: 173 mg/dL (ref 100–199)
HDL: 51 mg/dL (ref >39)
LDL Chol Calc (NIH): 107 mg/dL — ABNORMAL HIGH (ref 0–99)
Triglycerides: 83 mg/dL (ref 0–149)
VLDL Cholesterol Cal: 15 mg/dL (ref 5–40)

## 2019-07-20 LAB — VITAMIN D 25 HYDROXY (VIT D DEFICIENCY, FRACTURES): Vit D, 25-Hydroxy: 25.3 ng/mL — ABNORMAL LOW (ref 30.0–100.0)

## 2019-07-20 LAB — RPR: RPR Ser Ql: NONREACTIVE

## 2019-07-20 LAB — HIV ANTIBODY (ROUTINE TESTING W REFLEX): HIV Screen 4th Generation wRfx: NONREACTIVE

## 2019-07-21 LAB — CHLAMYDIA/GONOCOCCUS/TRICHOMONAS, NAA
Chlamydia by NAA: NEGATIVE
Gonococcus by NAA: NEGATIVE
Trich vag by NAA: NEGATIVE

## 2019-11-27 ENCOUNTER — Telehealth: Payer: Self-pay | Admitting: Obstetrics and Gynecology

## 2019-11-27 NOTE — Telephone Encounter (Signed)
Patient is having "some  Irregular bleeding after her cycles".

## 2019-11-27 NOTE — Telephone Encounter (Signed)
AEX 07/19/19 with JJ H/O vit D Def.  H/O menorrhagia leading to anemia  Spoke with pt. Pt states having irregular bleeding after having cycle. Pt states last cycle started on 7/25 and was "normal". Pt then states having brown vaginal discharge that started on 8/7 and was light at first, but now "heavier" for the last 2 days. Changing a pad only twice a day and its not soaking. Pt denies clots, fever, chills. Pt is having abd pain/cramps, has not taken any OTC meds. Pt states is SA and is not on contraception, has not taken UPT, states might be pregnant due to not using condoms. Pt advised to be seen for OV . Pt agreeable.  Pt scheduled with Dr Oscar La 8/12 at 845 am as work-in appt due to work schedule request. Pt agreeable and verbalized understanding to date and time of appt.   Encounter closed.

## 2019-11-29 NOTE — Progress Notes (Signed)
GYNECOLOGY  VISIT   HPI: 24 y.o.   Single Black or African American Not Hispanic or Latino  female   G1P0010 with No LMP recorded.   here for  Irregular bleeding, not on birth control. Patient had a normal period on 7/25 and then you started to bleed on 8/7 was light at first but now it is heavier. She states that she did have some cramping. She says that she changed her pad about 2 times during the day on the heavy days.  She stopped spotting yesterday.  UPT- negative   She has a h/o menorrhagia leading to anemia. She was not anemic at the time of her annual exam in 3/21. Cycles are ~q 31 days x 4-5 days, saturating a pad in 2-3 hours. Normally no BTB. She had a normal cycle 7/24-7/28, normal flow. Started bleeding on 8/7-8/10, spotting initially then moderate flow. She was changing her pad 3 pads in 24 hours. She did have cramping and a sharp pain from her vaginal upward. No change in sexual partners. Not using condoms, doing w/d for contraception. She did use plan B ~ August 2. Okay if she gets pregnant. Same partner for 2 years.   GYNECOLOGIC HISTORY: No LMP recorded. Contraception: none Menopausal hormone therapy: none        OB History    Gravida  1   Para      Term      Preterm      AB  1   Living        SAB  1   TAB      Ectopic      Multiple      Live Births                 Patient Active Problem List   Diagnosis Date Noted  . Menorrhagia 07/15/2018  . Eczematous dermatitis of upper eyelids of both eyes 07/03/2018  . Iron deficiency anemia due to chronic blood loss 06/01/2017  . Palpitations 06/01/2017    Past Medical History:  Diagnosis Date  . Alopecia   . Anemia   . Anxiety   . Depression   . Dysmenorrhea   . STD (sexually transmitted disease), trichomonas 01/2017    No past surgical history on file.  Current Outpatient Medications  Medication Sig Dispense Refill  . fluocinolone (SYNALAR) 0.01 % external solution Apply topically 2 (two)  times daily.    Marland Kitchen ibuprofen (ADVIL) 800 MG tablet Take 1 tablet (800 mg total) by mouth every 8 (eight) hours as needed. 30 tablet 1  . tacrolimus (PROTOPIC) 0.1 % ointment Apply topically 2 (two) times daily.     No current facility-administered medications for this visit.     ALLERGIES: Patient has no known allergies.  Family History  Problem Relation Age of Onset  . Multiple sclerosis Mother   . Hypertension Father   . Diabetes Paternal Grandmother   . Hypertension Paternal Grandmother   . Stroke Paternal Grandmother     Social History   Socioeconomic History  . Marital status: Single    Spouse name: Not on file  . Number of children: Not on file  . Years of education: Not on file  . Highest education level: Not on file  Occupational History  . Not on file  Tobacco Use  . Smoking status: Never Smoker  . Smokeless tobacco: Never Used  Vaping Use  . Vaping Use: Never used  Substance and Sexual Activity  . Alcohol  use: Yes    Alcohol/week: 1.0 standard drink    Types: 1 Standard drinks or equivalent per week  . Drug use: Yes    Types: Marijuana  . Sexual activity: Yes    Partners: Male    Birth control/protection: None  Other Topics Concern  . Not on file  Social History Narrative  . Not on file   Social Determinants of Health   Financial Resource Strain:   . Difficulty of Paying Living Expenses:   Food Insecurity:   . Worried About Programme researcher, broadcasting/film/video in the Last Year:   . Barista in the Last Year:   Transportation Needs:   . Freight forwarder (Medical):   Marland Kitchen Lack of Transportation (Non-Medical):   Physical Activity:   . Days of Exercise per Week:   . Minutes of Exercise per Session:   Stress:   . Feeling of Stress :   Social Connections:   . Frequency of Communication with Friends and Family:   . Frequency of Social Gatherings with Friends and Family:   . Attends Religious Services:   . Active Member of Clubs or Organizations:   .  Attends Banker Meetings:   Marland Kitchen Marital Status:   Intimate Partner Violence:   . Fear of Current or Ex-Partner:   . Emotionally Abused:   Marland Kitchen Physically Abused:   . Sexually Abused:     Review of Systems  All other systems reviewed and are negative.   PHYSICAL EXAMINATION:    There were no vitals taken for this visit.    General appearance: alert, cooperative and appears stated age  Pelvic: External genitalia:  no lesions              Urethra:  normal appearing urethra with no masses, tenderness or lesions              Bartholins and Skenes: normal                 Vagina: normal appearing vagina with normal color and discharge, no lesions              Cervix: no lesions              Bimanual Exam:  Uterus:  normal size, contour, position, consistency, mobility, non-tender              Adnexa: no mass, fullness, tenderness                Chaperone was present for exam.  ASSESSMENT Mid cycle bleeding, likely from taking plan B Declines contraception STD testing    PLAN UPT negative Start multivitamin with folic acid Script for [place] given Screening STD Refill of ibuprofen sent at patient request

## 2019-11-30 ENCOUNTER — Encounter: Payer: Self-pay | Admitting: Obstetrics and Gynecology

## 2019-11-30 ENCOUNTER — Ambulatory Visit (INDEPENDENT_AMBULATORY_CARE_PROVIDER_SITE_OTHER): Payer: 59 | Admitting: Obstetrics and Gynecology

## 2019-11-30 ENCOUNTER — Other Ambulatory Visit: Payer: Self-pay

## 2019-11-30 VITALS — BP 110/62 | HR 83 | Ht 59.0 in | Wt 135.0 lb

## 2019-11-30 DIAGNOSIS — Z789 Other specified health status: Secondary | ICD-10-CM | POA: Diagnosis not present

## 2019-11-30 DIAGNOSIS — N926 Irregular menstruation, unspecified: Secondary | ICD-10-CM | POA: Diagnosis not present

## 2019-11-30 DIAGNOSIS — Z01419 Encounter for gynecological examination (general) (routine) without abnormal findings: Secondary | ICD-10-CM

## 2019-11-30 DIAGNOSIS — Z0000 Encounter for general adult medical examination without abnormal findings: Secondary | ICD-10-CM

## 2019-11-30 DIAGNOSIS — Z113 Encounter for screening for infections with a predominantly sexual mode of transmission: Secondary | ICD-10-CM | POA: Diagnosis not present

## 2019-11-30 LAB — POCT URINE PREGNANCY: Preg Test, Ur: NEGATIVE

## 2019-11-30 MED ORDER — ELLA 30 MG PO TABS: 1.0000 | ORAL_TABLET | Freq: Once | ORAL | 0 refills | Status: AC

## 2019-11-30 MED ORDER — IBUPROFEN 800 MG PO TABS: 800.0000 mg | ORAL_TABLET | Freq: Three times a day (TID) | ORAL | 1 refills | Status: DC | PRN

## 2019-11-30 NOTE — Patient Instructions (Signed)
Emergency Contraception Emergency contraception refers to birth control methods that prevent pregnancy after unprotected sex. Emergency contraception may be recommended:  When a condom breaks.  After a sexual assault.  If you forgot to take your birth control pill.  If you used inadequate contraception during sex. Emergency contraception is most effective if used as soon as possible after sex. It is important to note that it does not protect against STIs (sexually transmitted infections). Do not use emergency contraception as your only form of birth control. Types of emergency contraceptives Emergency contraception must be used within 5 days after having unprotected sex. The following types of emergency contraception are available:  Hormonal pills that work by preventing the ovaries from releasing an egg (ovulation) and preventing fertilization of an egg. There are two types of hormonal pills: ? A pill that contains high doses of estrogen and progesterone. ? A pill that contains only progesterone. This may be a single pill or two pills taken 12-24 hours apart.  A non-hormonal pill that works by preventing progesterone from having its normal effect on ovulation and the lining of the uterus (endometrium). A prescription for this medicine is usually required.  A non-hormonal medical device that is inserted into the uterus (copper intrauterine device, IUD). The copper in the IUD causes the sperm to be less able to fertilize the egg. A health care provider must insert the IUD. Most types of emergency contraceptives are available without a prescription or a visit with your health care provider. If you are younger than 72, you may need a prescription. Talk with your pharmacist about your options. Side effects Ask your health care provider about the possible side effects of emergency contraceptives. Side effects may include:  Abdominal pain and cramps.  Nausea and vomiting.  Breast  tenderness.  Headache.  Dizziness.  Fatigue.  Irregular bleeding or spotting. If you take emergency contraception while you are pregnant, it will not end your pregnancy or harm your baby. Follow these instructions at home:  Take over-the-counter and prescription medicines only as told by your health care provider.  Eat something before taking the emergency contraception pills. This can help prevent nausea.  If you feel tired or dizzy, rest until you feel better.  Continue using your normal method of birth control. Contact a health care provider if:  You vomit within 2 hours after taking a pill. You may need to take another pill.  You have a severe headache.  You have bleeding that does not stop.  It has been 21 days since you took an emergency contraception pill and you have not had a menstrual period. Get help right away if:  You have chest pain.  You have leg pain.  You have numbness or weakness of your arms or legs.  You have slurred speech.  You have visual problems. Summary  Emergency contraceptives prevent pregnancy after unprotected sex.  Emergency contraception will not work if you are already pregnant and will not harm the baby if you are pregnant.  Some emergency contraceptives are available from your local pharmacy without a prescription.  Talk to your health care provider about the type of emergency contraceptives that are best for you. This information is not intended to replace advice given to you by your health care provider. Make sure you discuss any questions you have with your health care provider. Document Revised: 03/19/2017 Document Reviewed: 05/19/2016 Elsevier Patient Education  2020 [employer].

## 2019-12-01 ENCOUNTER — Encounter: Payer: Self-pay | Admitting: Obstetrics and Gynecology

## 2019-12-01 LAB — CHLAMYDIA/GONOCOCCUS/TRICHOMONAS, NAA
Chlamydia by NAA: NEGATIVE
Gonococcus by NAA: NEGATIVE
Trich vag by NAA: NEGATIVE

## 2019-12-01 LAB — HIV ANTIBODY (ROUTINE TESTING W REFLEX): HIV Screen 4th Generation wRfx: NONREACTIVE

## 2019-12-01 LAB — RPR: RPR Ser Ql: NONREACTIVE

## 2020-03-21 ENCOUNTER — Other Ambulatory Visit: Payer: Self-pay

## 2020-03-21 DIAGNOSIS — Z01419 Encounter for gynecological examination (general) (routine) without abnormal findings: Secondary | ICD-10-CM

## 2020-03-21 NOTE — Telephone Encounter (Signed)
Medication refill request: Ibuprofen 800mg   Last AEX:  07/19/19 Next AEX: 06/10/20 Last MMG (if hormonal medication request): NA Refill authorized: 30/1

## 2020-03-21 NOTE — Telephone Encounter (Signed)
Patient would like refill on ibruprofen and plan B. [facility] at [phone]

## 2020-03-22 ENCOUNTER — Other Ambulatory Visit: Payer: Self-pay

## 2020-03-22 ENCOUNTER — Other Ambulatory Visit: Payer: Self-pay | Admitting: Obstetrics and Gynecology

## 2020-03-22 DIAGNOSIS — Z789 Other specified health status: Secondary | ICD-10-CM

## 2020-03-22 MED ORDER — IBUPROFEN 800 MG PO TABS: 800.0000 mg | ORAL_TABLET | Freq: Three times a day (TID) | ORAL | 1 refills | Status: DC | PRN

## 2020-03-22 NOTE — Telephone Encounter (Signed)
Patient is calling in regards to plan B prescription.

## 2020-03-22 NOTE — Telephone Encounter (Signed)
Medication refill request: Brenda Sanchez  Last AEX:  07/19/19 Next AEX: 06/10/20 Last MMG (if hormonal medication request): NA Refill authorized: 1/0

## 2020-03-22 NOTE — Telephone Encounter (Signed)
RX request on drs desk for review.   Brenda Sanchez

## 2020-06-06 NOTE — Progress Notes (Signed)
25 y.o. G1P0010 Single Black or African American Not Hispanic or Latino female here for annual exam.  She states that she she gets sharp pains in her pelvic floor during her period.  Prior h/o menorrhagia leading to anemia. Her cycles have gotten lighter. Not anemic since 6/20.  Sexually active, same partner x 3 years. Living with her partner. Using condoms and W/D sometimes. No dyspareunia. She has a low libido, not enjoying sex as much, not having orgasms.  Period Cycle (Days): 30 Period Duration (Days): 5 Period Pattern: Regular Menstrual Flow: Moderate Menstrual Control: Maxi pad,Thin pad Menstrual Control Change Freq (Hours): 3-4 Dysmenorrhea: (!) Mild Dysmenorrhea Symptoms: Diarrhea,Cramping,Nausea  Patient's last menstrual period was 06/09/2020.          Sexually active: Yes.    The current method of family planning is condoms sometimes.    Exercising: No.  The patient does not participate in regular exercise at present. Smoker:  no  Health Maintenance: Pap: 05/02/17 Normal  History of abnormal Pap:  no MMG:  None  BMD:   None  Colonoscopy: none  TDaP:  Unsure, declines. Gardasil: never She grew up not getting vaccinations   reports that she has never smoked. She has never used smokeless tobacco. She reports current alcohol use of about 1.0 standard drink of alcohol per week. She reports current drug use. Drug: Marijuana. She is doing a Audiological scientist estate course, hopes to be done in the next few months. Working in Engineering geologist.   Past Medical History:  Diagnosis Date  . Alopecia   . Anemia   . Anxiety   . Depression   . Dysmenorrhea   . STD (sexually transmitted disease), trichomonas 01/2017    History reviewed. No pertinent surgical history.  Current Outpatient Medications  Medication Sig Dispense Refill  . ELLA 30 MG tablet TAKE 1 TABLET BY MOUTH ONCE FOR 1 DOSE 1 tablet 0  . fluocinolone (SYNALAR) 0.01 % external solution Apply topically 2 (two) times daily.    Marland Kitchen ibuprofen  (ADVIL) 800 MG tablet Take 1 tablet (800 mg total) by mouth every 8 (eight) hours as needed. 30 tablet 1   No current facility-administered medications for this visit.    Family History  Problem Relation Age of Onset  . Multiple sclerosis Mother   . Diabetes Paternal Grandmother   . Hypertension Paternal Grandmother   . Stroke Paternal Grandmother     Review of Systems  All other systems reviewed and are negative.   Exam:   BP 110/62   Pulse 77   Ht 4\' 11"  (1.499 m)   Wt 138 lb 12.8 oz (63 kg)   LMP 06/09/2020   SpO2 100%   BMI 28.03 kg/m   Weight change: @WEIGHTCHANGE @ Height:   Height: 4\' 11"  (149.9 cm)  Ht Readings from Last 3 Encounters:  06/10/20 4\' 11"  (1.499 m)  11/30/19 4\' 11"  (1.499 m)  07/19/19 4\' 11"  (1.499 m)    General appearance: alert, cooperative and appears stated age Head: Normocephalic, without obvious abnormality, atraumatic Neck: no adenopathy, supple, symmetrical, trachea midline and thyroid normal to inspection and palpation Lungs: clear to auscultation bilaterally Cardiovascular: regular rate and rhythm Breasts: normal appearance, no masses or tenderness Abdomen: soft, non-tender; non distended,  no masses,  no organomegaly Extremities: extremities normal, atraumatic, no cyanosis or edema Skin: Skin color, texture, turgor normal. No rashes or lesions Lymph nodes: Cervical, supraclavicular, and axillary nodes normal. No abnormal inguinal nodes palpated Neurologic: Grossly normal   Pelvic: External  genitalia:  no lesions              Urethra:  normal appearing urethra with no masses, tenderness or lesions              Bartholins and Skenes: normal                 Vagina: normal appearing vagina with normal color and discharge, no lesions              Cervix: no lesions               Bimanual Exam:  Uterus:  normal size, contour, position, consistency, mobility, non-tender and anteverted              Adnexa: no mass, fullness, tenderness                Rectovaginal: deferred  Carolynn Serve chaperoned for the exam.  1. Well woman exam Discussed breast self exam Discussed calcium and vit D intake Using condoms for contraception  2. Vitamin D deficiency  - VITAMIN D 25 Hydroxy (Vit-D Deficiency, Fractures)  3. History of anemia  - CBC  4. Laboratory exam ordered as part of routine general medical examination  - CBC - Comprehensive metabolic panel - Lipid panel  5. Screening for cervical cancer  - Cytology - PAP  6. Screening examination for STD (sexually transmitted disease)  - Cytology - PAP - HIV Antibody (routine testing w rflx) - RPR  Patient having issues with low libido, information given, [facility] for [facility].

## 2020-06-10 ENCOUNTER — Encounter: Payer: Self-pay | Admitting: Obstetrics and Gynecology

## 2020-06-10 ENCOUNTER — Other Ambulatory Visit: Payer: Self-pay

## 2020-06-10 ENCOUNTER — Other Ambulatory Visit (HOSPITAL_COMMUNITY)
Admission: RE | Admit: 2020-06-10 | Discharge: 2020-06-10 | Disposition: A | Discharge status: Home / Self Care | Payer: 59 | Source: Ambulatory Visit | Attending: Obstetrics and Gynecology | Admitting: Obstetrics and Gynecology

## 2020-06-10 ENCOUNTER — Ambulatory Visit (INDEPENDENT_AMBULATORY_CARE_PROVIDER_SITE_OTHER): Payer: 59 | Admitting: Obstetrics and Gynecology

## 2020-06-10 VITALS — BP 110/62 | HR 77 | Ht 59.0 in | Wt 138.8 lb

## 2020-06-10 DIAGNOSIS — Z0000 Encounter for general adult medical examination without abnormal findings: Secondary | ICD-10-CM

## 2020-06-10 DIAGNOSIS — Z124 Encounter for screening for malignant neoplasm of cervix: Secondary | ICD-10-CM | POA: Insufficient documentation

## 2020-06-10 DIAGNOSIS — Z113 Encounter for screening for infections with a predominantly sexual mode of transmission: Secondary | ICD-10-CM | POA: Diagnosis present

## 2020-06-10 DIAGNOSIS — E559 Vitamin D deficiency, unspecified: Secondary | ICD-10-CM | POA: Diagnosis not present

## 2020-06-10 DIAGNOSIS — Z862 Personal history of diseases of the blood and blood-forming organs and certain disorders involving the immune mechanism: Secondary | ICD-10-CM

## 2020-06-10 DIAGNOSIS — Z01419 Encounter for gynecological examination (general) (routine) without abnormal findings: Secondary | ICD-10-CM

## 2020-06-10 NOTE — Patient Instructions (Signed)

## 2020-06-11 LAB — LIPID PANEL
Cholesterol: 168 mg/dL (ref <200)
HDL: 52 mg/dL (ref >=50)
LDL Cholesterol (Calc): 98 mg/dL (calc)
Non-HDL Cholesterol (Calc): 116 mg/dL (calc) (ref <130)
Total CHOL/HDL Ratio: 3.2 (calc) (ref <5.0)
Triglycerides: 88 mg/dL (ref <150)

## 2020-06-11 LAB — HIV ANTIBODY (ROUTINE TESTING W REFLEX): HIV 1&2 Ab, 4th Generation: NONREACTIVE

## 2020-06-11 LAB — COMPREHENSIVE METABOLIC PANEL
AG Ratio: 1.4 (calc) (ref 1.0–2.5)
ALT: 9 U/L (ref 6–29)
AST: 17 U/L (ref 10–30)
Albumin: 4.3 g/dL (ref 3.6–5.1)
Alkaline phosphatase (APISO): 42 U/L (ref 31–125)
BUN: 15 mg/dL (ref 7–25)
CO2: 30 mmol/L (ref 20–32)
Calcium: 9.5 mg/dL (ref 8.6–10.2)
Chloride: 104 mmol/L (ref 98–110)
Creat: 0.79 mg/dL (ref 0.50–1.10)
Globulin: 3 g/dL (calc) (ref 1.9–3.7)
Glucose, Bld: 70 mg/dL (ref 65–99)
Potassium: 3.8 mmol/L (ref 3.5–5.3)
Sodium: 141 mmol/L (ref 135–146)
Total Bilirubin: 0.3 mg/dL (ref 0.2–1.2)
Total Protein: 7.3 g/dL (ref 6.1–8.1)

## 2020-06-11 LAB — CBC
HCT: 32.1 % — ABNORMAL LOW (ref 35.0–45.0)
Hemoglobin: 10.3 g/dL — ABNORMAL LOW (ref 11.7–15.5)
MCH: 28.5 pg (ref 27.0–33.0)
MCHC: 32.1 g/dL (ref 32.0–36.0)
MCV: 88.9 fL (ref 80.0–100.0)
MPV: 10.8 fL (ref 7.5–12.5)
Platelets: 330 10*3/uL (ref 140–400)
RBC: 3.61 10*6/uL — ABNORMAL LOW (ref 3.80–5.10)
RDW: 14.4 % (ref 11.0–15.0)
WBC: 5.2 10*3/uL (ref 3.8–10.8)

## 2020-06-11 LAB — RPR: RPR Ser Ql: NONREACTIVE

## 2020-06-11 LAB — VITAMIN D 25 HYDROXY (VIT D DEFICIENCY, FRACTURES): Vit D, 25-Hydroxy: 29 ng/mL — ABNORMAL LOW (ref 30–100)

## 2020-06-13 LAB — CYTOLOGY - PAP
Chlamydia: NEGATIVE
Comment: NEGATIVE
Comment: NORMAL
Neisseria Gonorrhea: NEGATIVE

## 2020-06-14 ENCOUNTER — Other Ambulatory Visit: Payer: Self-pay | Admitting: Obstetrics and Gynecology

## 2020-06-14 DIAGNOSIS — Z862 Personal history of diseases of the blood and blood-forming organs and certain disorders involving the immune mechanism: Secondary | ICD-10-CM

## 2020-08-14 ENCOUNTER — Telehealth: Payer: Self-pay

## 2020-08-14 DIAGNOSIS — N926 Irregular menstruation, unspecified: Secondary | ICD-10-CM

## 2020-08-14 NOTE — Telephone Encounter (Signed)
Cycles are usually regular every 29-30 days.  Patient said is she at 37 days now with no menses. She has had two negative home UPT's. She does not use birth control and is sexually active. She asked if she could come in and have a blood pregnancy test checked.

## 2020-08-15 NOTE — Telephone Encounter (Signed)
Yes, please have her come in for a BhcG

## 2020-08-16 ENCOUNTER — Other Ambulatory Visit: Payer: Self-pay

## 2020-08-16 ENCOUNTER — Other Ambulatory Visit: Payer: Medicaid Other

## 2020-08-16 DIAGNOSIS — N926 Irregular menstruation, unspecified: Secondary | ICD-10-CM

## 2020-08-16 NOTE — Telephone Encounter (Signed)
Patient coming today at 4:00pm

## 2020-08-16 NOTE — Telephone Encounter (Signed)
Patient informed, she will call back to schedule lab appointment.

## 2020-08-17 LAB — HCG, SERUM, QUALITATIVE: Preg, Serum: NEGATIVE

## 2020-09-11 ENCOUNTER — Ambulatory Visit: Payer: Medicaid Other | Admitting: Obstetrics and Gynecology

## 2020-10-23 ENCOUNTER — Ambulatory Visit: Payer: Self-pay | Admitting: Obstetrics and Gynecology

## 2020-10-25 ENCOUNTER — Other Ambulatory Visit: Payer: Self-pay | Admitting: *Deleted

## 2020-10-25 DIAGNOSIS — R87612 Low grade squamous intraepithelial lesion on cytologic smear of cervix (LGSIL): Secondary | ICD-10-CM

## 2020-10-28 ENCOUNTER — Ambulatory Visit (INDEPENDENT_AMBULATORY_CARE_PROVIDER_SITE_OTHER): Payer: Self-pay | Admitting: Obstetrics and Gynecology

## 2020-10-28 ENCOUNTER — Other Ambulatory Visit: Payer: Self-pay

## 2020-10-28 ENCOUNTER — Encounter: Payer: Self-pay | Admitting: Obstetrics and Gynecology

## 2020-10-28 ENCOUNTER — Other Ambulatory Visit (HOSPITAL_COMMUNITY)
Admission: RE | Admit: 2020-10-28 | Discharge: 2020-10-28 | Disposition: A | Discharge status: Home / Self Care | Payer: Medicaid Other | Source: Ambulatory Visit | Attending: Obstetrics and Gynecology | Admitting: Obstetrics and Gynecology

## 2020-10-28 VITALS — BP 118/72 | HR 77 | Ht 59.0 in | Wt 140.4 lb

## 2020-10-28 DIAGNOSIS — R87612 Low grade squamous intraepithelial lesion on cytologic smear of cervix (LGSIL): Secondary | ICD-10-CM | POA: Diagnosis present

## 2020-10-28 DIAGNOSIS — Z862 Personal history of diseases of the blood and blood-forming organs and certain disorders involving the immune mechanism: Secondary | ICD-10-CM

## 2020-10-28 DIAGNOSIS — Z01812 Encounter for preprocedural laboratory examination: Secondary | ICD-10-CM

## 2020-10-28 LAB — PREGNANCY, URINE: Preg Test, Ur: NEGATIVE

## 2020-10-28 NOTE — Addendum Note (Signed)
Addended by: Rushie Goltz on: 10/28/2020 12:17 PM   Modules accepted: Orders

## 2020-10-28 NOTE — Progress Notes (Signed)
GYNECOLOGY  VISIT   HPI: 25 y.o.   Single Black or African American Not Hispanic or Latino  female   G1P0010 with Patient's last menstrual period was 10/16/2020.   here for colposcopy for a LSIL pap smear.   At the time of her annual exam in 2/22 her Hgb was 10.2. She is taking iron off and on. Is getting liquid iron to try to help her tolerate it.  Also taking her vit d off and on, but not consistently   GYNECOLOGIC HISTORY: Patient's last menstrual period was 10/16/2020. Contraception:none  Menopausal hormone therapy: none         OB History     Gravida  1   Para      Term      Preterm      AB  1   Living         SAB  1   IAB      Ectopic      Multiple      Live Births                 Patient Active Problem List   Diagnosis Date Noted   Menorrhagia 07/15/2018   Eczematous dermatitis of upper eyelids of both eyes 07/03/2018   Iron deficiency anemia due to chronic blood loss 06/01/2017   Palpitations 06/01/2017    Past Medical History:  Diagnosis Date   Alopecia    Anemia    Anxiety    Depression    Dysmenorrhea    STD (sexually transmitted disease), trichomonas 01/2017    No past surgical history on file.  Current Outpatient Medications  Medication Sig Dispense Refill   ELLA 30 MG tablet TAKE 1 TABLET BY MOUTH ONCE FOR 1 DOSE 1 tablet 0   fluocinolone (SYNALAR) 0.01 % external solution Apply topically 2 (two) times daily.     ibuprofen (ADVIL) 800 MG tablet Take 1 tablet (800 mg total) by mouth every 8 (eight) hours as needed. 30 tablet 1   No current facility-administered medications for this visit.     ALLERGIES: Patient has no known allergies.  Family History  Problem Relation Age of Onset   Multiple sclerosis Mother    Diabetes Paternal Grandmother    Hypertension Paternal Grandmother    Stroke Paternal Grandmother     Social History   Socioeconomic History   Marital status: Single    Spouse name: Not on file   Number of  children: Not on file   Years of education: Not on file   Highest education level: Not on file  Occupational History   Not on file  Tobacco Use   Smoking status: Never   Smokeless tobacco: Never  Vaping Use   Vaping Use: Never used  Substance and Sexual Activity   Alcohol use: Yes    Alcohol/week: 1.0 standard drink    Types: 1 Standard drinks or equivalent per week   Drug use: Yes    Types: Marijuana   Sexual activity: Yes    Partners: Male    Birth control/protection: None  Other Topics Concern   Not on file  Social History Narrative   Not on file   Social Determinants of Health   Financial Resource Strain: Not on file  Food Insecurity: Not on file  Transportation Needs: Not on file  Physical Activity: Not on file  Stress: Not on file  Social Connections: Not on file  Intimate Partner Violence: Not on file  Review of Systems  All other systems reviewed and are negative.  PHYSICAL EXAMINATION:    BP 118/72   Pulse 77   Ht 4\' 11"  (1.499 m)   Wt 140 lb 6.4 oz (63.7 kg)   LMP 10/16/2020   SpO2 100%   BMI 28.36 kg/m     General appearance: alert, cooperative and appears stated age   Pelvic: External genitalia:  no lesions              Urethra:  normal appearing urethra with no masses, tenderness or lesions              Bartholins and Skenes: normal                 Vagina: normal appearing vagina with normal color and discharge, no lesions              Cervix: no gross lesions  Colposcopy: unsatisfactory, no aceto-white changes. Negative lugols examination of the cervix and vagina. ECC obtained.   Chaperone was present for exam.  1. Low grade squamous intraepithelial lesion (LGSIL) on cervical Pap smear Unsatisfactory, no lesions - Colposcopy - Surgical pathology( [facility]/ [facility])  2. Pre-procedure lab exam - Pregnancy, urine: negative  3. History of anemia Inconsistently taking iron. Cycle are normal. - CBC - Ferritin

## 2020-10-30 LAB — SURGICAL PATHOLOGY

## 2020-10-31 ENCOUNTER — Other Ambulatory Visit: Payer: Self-pay

## 2021-05-27 NOTE — Progress Notes (Unsigned)
26 y.o. G1P0010 Single Black or African American Not Hispanic or Latino female here for annual exam.      No LMP recorded.          Sexually active: {yes no:314532}  The current method of family planning is {contraception:315051}.    Exercising: {yes no:314532}  {types:19826} Smoker:  {YES NO:22349}  Health Maintenance: Pap:  06/10/20 LSIL  05/20/17  WNL  History of abnormal Pap:  yes colpo 10/28/20 Neg  MMG:  none  BMD:   none  Colonoscopy: none  TDaP:  unsure she declines  Gardasil: never she grew up not getting vaccinations    reports that she has never smoked. She has never used smokeless tobacco. She reports current alcohol use of about 1.0 standard drink per week. She reports current drug use. Drug: Marijuana.  Past Medical History:  Diagnosis Date   Alopecia    Anemia    Anxiety    Depression    Dysmenorrhea    STD (sexually transmitted disease), trichomonas 01/2017    No past surgical history on file.  Current Outpatient Medications  Medication Sig Dispense Refill   ELLA 30 MG tablet TAKE 1 TABLET BY MOUTH ONCE FOR 1 DOSE 1 tablet 0   fluocinolone (SYNALAR) 0.01 % external solution Apply topically 2 (two) times daily.     ibuprofen (ADVIL) 800 MG tablet Take 1 tablet (800 mg total) by mouth every 8 (eight) hours as needed. 30 tablet 1   No current facility-administered medications for this visit.    Family History  Problem Relation Age of Onset   Multiple sclerosis Mother    Diabetes Paternal Grandmother    Hypertension Paternal Grandmother    Stroke Paternal Grandmother     Review of Systems  Exam:   There were no vitals taken for this visit.  Weight change: @WEIGHTCHANGE @ Height:      Ht Readings from Last 3 Encounters:  10/28/20 4\' 11"  (1.499 m)  06/10/20 4\' 11"  (1.499 m)  11/30/19 4\' 11"  (1.499 m)    General appearance: alert, cooperative and appears stated age Head: Normocephalic, without obvious abnormality, atraumatic Neck: no adenopathy,  supple, symmetrical, trachea midline and thyroid {CHL AMB PHY EX THYROID NORM DEFAULT:[phone]::"normal to inspection and palpation"} Lungs: clear to auscultation bilaterally Cardiovascular: regular rate and rhythm Breasts: {Exam; breast:13139::"normal appearance, no masses or tenderness"} Abdomen: soft, non-tender; non distended,  no masses,  no organomegaly Extremities: extremities normal, atraumatic, no cyanosis or edema Skin: Skin color, texture, turgor normal. No rashes or lesions Lymph nodes: Cervical, supraclavicular, and axillary nodes normal. No abnormal inguinal nodes palpated Neurologic: Grossly normal   Pelvic: External genitalia:  no lesions              Urethra:  normal appearing urethra with no masses, tenderness or lesions              Bartholins and Skenes: normal                 Vagina: normal appearing vagina with normal color and discharge, no lesions              Cervix: {CHL AMB PHY EX CERVIX NORM [phone]::"no lesions"}               Bimanual Exam:  Uterus:  {CHL AMB PHY EX UTERUS NORM DEFAULT:[phone]::"normal size, contour, position, consistency, mobility, non-tender"}              Adnexa: {CHL AMB PHY EX ADNEXA NO MASS  DEFAULT:[phone]::"no mass, fullness, tenderness"}               Rectovaginal: Confirms               Anus:  normal sphincter tone, no lesions  *** chaperoned for the exam.  A:  Well Woman with normal exam  P:

## 2021-06-04 ENCOUNTER — Ambulatory Visit: Payer: Self-pay | Admitting: Obstetrics and Gynecology

## 2021-06-04 ENCOUNTER — Encounter: Payer: Self-pay | Admitting: Family

## 2021-06-04 ENCOUNTER — Encounter: Payer: Self-pay | Admitting: Obstetrics and Gynecology

## 2021-06-04 ENCOUNTER — Other Ambulatory Visit: Payer: Self-pay

## 2021-06-04 ENCOUNTER — Other Ambulatory Visit (HOSPITAL_COMMUNITY)
Admission: RE | Admit: 2021-06-04 | Discharge: 2021-06-04 | Disposition: A | Discharge status: Home / Self Care | Payer: Medicaid Other | Source: Ambulatory Visit | Attending: Obstetrics and Gynecology | Admitting: Obstetrics and Gynecology

## 2021-06-04 ENCOUNTER — Ambulatory Visit (INDEPENDENT_AMBULATORY_CARE_PROVIDER_SITE_OTHER): Payer: Managed Care, Other (non HMO) | Admitting: Obstetrics and Gynecology

## 2021-06-04 VITALS — BP 104/62 | HR 72 | Ht 58.75 in | Wt 141.0 lb

## 2021-06-04 DIAGNOSIS — N946 Dysmenorrhea, unspecified: Secondary | ICD-10-CM

## 2021-06-04 DIAGNOSIS — E559 Vitamin D deficiency, unspecified: Secondary | ICD-10-CM

## 2021-06-04 DIAGNOSIS — Z8742 Personal history of other diseases of the female genital tract: Secondary | ICD-10-CM | POA: Diagnosis present

## 2021-06-04 DIAGNOSIS — N926 Irregular menstruation, unspecified: Secondary | ICD-10-CM

## 2021-06-04 DIAGNOSIS — Z01419 Encounter for gynecological examination (general) (routine) without abnormal findings: Secondary | ICD-10-CM

## 2021-06-04 DIAGNOSIS — Z124 Encounter for screening for malignant neoplasm of cervix: Secondary | ICD-10-CM | POA: Insufficient documentation

## 2021-06-04 DIAGNOSIS — Z862 Personal history of diseases of the blood and blood-forming organs and certain disorders involving the immune mechanism: Secondary | ICD-10-CM

## 2021-06-04 LAB — PREGNANCY, URINE: Preg Test, Ur: NEGATIVE

## 2021-06-04 MED ORDER — IBUPROFEN 800 MG PO TABS: 800.0000 mg | ORAL_TABLET | Freq: Three times a day (TID) | ORAL | 1 refills | Status: AC | PRN

## 2021-06-04 NOTE — Progress Notes (Signed)
26 y.o. G1P0010 Single Black or African American Not Hispanic or Latino female here for annual exam. Typically she has monthly cycles, but she had two cycles in the month of December 12/3-6 and 12/15-19.  Cycles are lighter than they used to be. At the most she will use 5 pads in a day.  Period Cycle (Days): 30 Period Duration (Days): 5 Period Pattern: Regular Menstrual Flow: Moderate Menstrual Control: Maxi pad, Thin pad Menstrual Control Change Freq (Hours): 3.5 Dysmenorrhea: (!) Moderate Dysmenorrhea Symptoms: Cramping, Diarrhea, Nausea Same partner x 3 years, not living together. No dyspareunia.   Patient's last menstrual period was 05/30/2021.          Sexually active: Yes.    The current method of family planning is none.    Exercising: No.  The patient does not participate in regular exercise at present. Smoker:  no  Health Maintenance: Pap:  06/10/20 LSIL 05/02/17 Normal  History of abnormal Pap:  yes colpo in 10/2020 unsatisfactory, no abnormalities seen, negative ECC.  MMG:  none  BMD:   none  Colonoscopy: none  TDaP:  unsure declines  Gardasil: never,she grew up not getting vaccinations   reports that she has never smoked. She has never used smokeless tobacco. She reports current alcohol use of about 1.0 standard drink per week. She reports current drug use. Drug: Marijuana. 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 She plans to buy houses and do short term rentals (ie air B&B or 3 month rentals for travelers).  Currently living with her mom and a sibling to save money.   Past Medical History:  Diagnosis Date   Alopecia    Anemia    Anxiety    Depression    Dysmenorrhea    STD (sexually transmitted disease), trichomonas 01/2017    History reviewed. No pertinent surgical history.  Current Outpatient Medications  Medication Sig Dispense Refill   fluocinolone (SYNALAR) 0.01 % external solution Apply  topically 2 (two) times daily.     ibuprofen (ADVIL) 800 MG tablet Take 1 tablet (800 mg total) by mouth every 8 (eight) hours as needed. 30 tablet 1   No current facility-administered medications for this visit.    Family History  Problem Relation Age of Onset   Multiple sclerosis Mother    Diabetes Paternal Grandmother    Hypertension Paternal Grandmother    Stroke Paternal Grandmother     Review of Systems  All other systems reviewed and are negative.  Exam:   BP 104/62    Pulse 72    Ht 4' 10.75" (1.492 m)    Wt 141 lb (64 kg)    LMP 05/30/2021    SpO2 100%    BMI 28.72 kg/m   Weight change: @WEIGHTCHANGE @ Height:   Height: 4' 10.75" (149.2 cm)  Ht Readings from Last 3 Encounters:  06/04/21 4' 10.75" (1.492 m)  10/28/20 4\' 11"  (1.499 m)  06/10/20 4\' 11"  (1.499 m)    General appearance: alert, cooperative and appears stated age Head: Normocephalic, without obvious abnormality, atraumatic Neck: no adenopathy, supple, symmetrical, trachea midline and thyroid normal to inspection and palpation Lungs: clear to auscultation bilaterally Cardiovascular: regular rate and rhythm Breasts: normal appearance, no masses or tenderness Abdomen: soft, non-tender; non distended,  no masses,  no organomegaly Extremities: extremities normal, atraumatic, no cyanosis or edema Skin: Skin color, texture, turgor normal. No rashes or lesions Lymph nodes: Cervical,  supraclavicular, and axillary nodes normal. No abnormal inguinal nodes palpated Neurologic: Grossly normal   Pelvic: External genitalia:  no lesions              Urethra:  normal appearing urethra with no masses, tenderness or lesions              Bartholins and Skenes: normal                 Vagina: normal appearing vagina with normal color and discharge, no lesions              Cervix: no lesions               Bimanual Exam:  Uterus:  normal size, contour, position, consistency, mobility, non-tender              Adnexa: no mass,  fullness, tenderness               Rectovaginal: Confirms               Anus:  normal sphincter tone, no lesions  Gae Dry chaperoned for the exam.  1. Well woman exam Discussed breast self awareness Declines contraception, using w/d, doesn't want Ella. Okay if she gets pregnant Recommended she start PNV  2. Screening for cervical cancer - Cytology - PAP  3. History of abnormal cervical Pap smear - Cytology - PAP  4. History of anemia Bleeding is lighter - CBC - Ferritin  5. Vitamin D deficiency Not on supplements. Discussed calcium and vit d - VITAMIN D 25 Hydroxy (Vit-D Deficiency, Fractures)  6. Irregular bleeding Just in 12/22 - Pregnancy, urine - TSH  7. Dysmenorrhea - ibuprofen (ADVIL) 800 MG tablet; Take 1 tablet (800 mg total) by mouth every 8 (eight) hours as needed.  Dispense: 30 tablet; Refill: 1

## 2021-06-04 NOTE — Patient Instructions (Signed)

## 2021-06-05 LAB — CBC
HCT: 30.6 % — ABNORMAL LOW (ref 35.0–45.0)
Hemoglobin: 9.2 g/dL — ABNORMAL LOW (ref 11.7–15.5)
MCH: 25.4 pg — ABNORMAL LOW (ref 27.0–33.0)
MCHC: 30.1 g/dL — ABNORMAL LOW (ref 32.0–36.0)
MCV: 84.5 fL (ref 80.0–100.0)
MPV: 10.7 fL (ref 7.5–12.5)
Platelets: 482 10*3/uL — ABNORMAL HIGH (ref 140–400)
RBC: 3.62 10*6/uL — ABNORMAL LOW (ref 3.80–5.10)
RDW: 16 % — ABNORMAL HIGH (ref 11.0–15.0)
WBC: 4.5 10*3/uL (ref 3.8–10.8)

## 2021-06-05 LAB — VITAMIN D 25 HYDROXY (VIT D DEFICIENCY, FRACTURES): Vit D, 25-Hydroxy: 25 ng/mL — ABNORMAL LOW (ref 30–100)

## 2021-06-05 LAB — FERRITIN: Ferritin: 2 ng/mL — ABNORMAL LOW (ref 16–154)

## 2021-06-05 LAB — TSH: TSH: 0.93 mIU/L

## 2021-06-06 ENCOUNTER — Telehealth: Payer: Self-pay | Admitting: *Deleted

## 2021-06-06 DIAGNOSIS — D508 Other iron deficiency anemias: Secondary | ICD-10-CM

## 2021-06-06 LAB — CYTOLOGY - PAP
Comment: NEGATIVE
Diagnosis: NEGATIVE
High risk HPV: NEGATIVE

## 2021-06-06 NOTE — Telephone Encounter (Signed)
-----   Message from Romualdo Bolk, MD sent at 06/05/2021  5:29 PM EST ----- Please let the patient know that she does have iron deficiency anemia again and place a referral to hematology. She should be on a daily over the counter iron supplement.  Ask if she would consider consider medication to make her cycles lighter (although they don't sound excessive)? Her vit d is low, she should increase her current vit d intake by 1,000 IU a day (long term). Her thyroid screen is normal. Her pap is pending.

## 2021-06-06 NOTE — Telephone Encounter (Signed)
Patient informed with below results. She will start vitamin d 1,000 units. Aware to take iron, she declined medication for cycles, reports cycles are not heavy and she didn't feel this is needed at the time.     Referral placed at [facility] they will call to schedule. Patient aware.

## 2021-06-10 NOTE — Telephone Encounter (Signed)
Hematology left message for patient to call.

## 2021-06-14 ENCOUNTER — Encounter: Payer: Self-pay | Admitting: Family

## 2021-06-17 NOTE — Telephone Encounter (Signed)
Dr.Jertson please see patient my chart response about referral.

## 2022-05-25 ENCOUNTER — Encounter: Payer: Self-pay | Admitting: Family

## 2022-05-29 ENCOUNTER — Ambulatory Visit: Payer: Managed Care, Other (non HMO) | Admitting: Obstetrics and Gynecology
